# Patient Record
Sex: Female | Born: 1965 | Hispanic: Yes | Marital: Married
Health system: Southern US, Community
[De-identification: ages and names within clinical notes are randomized; demographics above are authoritative.]

## PROBLEM LIST (undated history)

## (undated) DIAGNOSIS — N200 Calculus of kidney: Secondary | ICD-10-CM

## (undated) DIAGNOSIS — N644 Mastodynia: Secondary | ICD-10-CM

## (undated) DIAGNOSIS — D75839 Thrombocytosis, unspecified: Secondary | ICD-10-CM

## (undated) DIAGNOSIS — D3A029 Benign carcinoid tumor of the large intestine, unspecified portion: Secondary | ICD-10-CM

## (undated) DIAGNOSIS — D473 Essential (hemorrhagic) thrombocythemia: Secondary | ICD-10-CM

## (undated) DIAGNOSIS — R896 Abnormal cytological findings in specimens from other organs, systems and tissues: Secondary | ICD-10-CM

## (undated) DIAGNOSIS — D471 Chronic myeloproliferative disease: Secondary | ICD-10-CM

## (undated) HISTORY — DX: Chronic myeloproliferative disease: D47.1

## (undated) HISTORY — DX: Thrombocytosis, unspecified: D75.839

## (undated) HISTORY — DX: Essential (hemorrhagic) thrombocythemia: D47.3

## (undated) HISTORY — DX: Abnormal cytological findings in specimens from other organs, systems and tissues: R89.6

## (undated) HISTORY — PX: TUBAL LIGATION: SHX77

## (undated) HISTORY — DX: Calculus of kidney: N20.0

## (undated) HISTORY — DX: Benign carcinoid tumor of the large intestine, unspecified portion: D3A.029

## (undated) HISTORY — DX: Mastodynia: N64.4

---

## 1999-11-18 ENCOUNTER — Encounter: Admission: RE | Admit: 1999-11-18 | Discharge: 1999-11-18 | Payer: Self-pay | Admitting: Family Medicine

## 2001-04-06 ENCOUNTER — Encounter: Admission: RE | Admit: 2001-04-06 | Discharge: 2001-04-06 | Payer: Self-pay | Admitting: Sports Medicine

## 2001-05-13 DIAGNOSIS — IMO0001 Reserved for inherently not codable concepts without codable children: Secondary | ICD-10-CM

## 2001-05-13 HISTORY — DX: Reserved for inherently not codable concepts without codable children: IMO0001

## 2001-07-13 ENCOUNTER — Encounter (INDEPENDENT_AMBULATORY_CARE_PROVIDER_SITE_OTHER): Payer: Self-pay | Admitting: *Deleted

## 2001-07-14 ENCOUNTER — Encounter: Admission: RE | Admit: 2001-07-14 | Discharge: 2001-07-14 | Payer: Self-pay | Admitting: Family Medicine

## 2001-07-19 ENCOUNTER — Ambulatory Visit (HOSPITAL_COMMUNITY): Admission: RE | Admit: 2001-07-19 | Discharge: 2001-07-19 | Payer: Self-pay | Admitting: Family Medicine

## 2001-08-09 ENCOUNTER — Encounter: Admission: RE | Admit: 2001-08-09 | Discharge: 2001-08-09 | Payer: Self-pay | Admitting: Sports Medicine

## 2003-05-30 ENCOUNTER — Encounter: Admission: RE | Admit: 2003-05-30 | Discharge: 2003-05-30 | Payer: Self-pay | Admitting: Family Medicine

## 2003-06-02 ENCOUNTER — Encounter: Admission: RE | Admit: 2003-06-02 | Discharge: 2003-06-02 | Payer: Self-pay | Admitting: Family Medicine

## 2004-01-16 ENCOUNTER — Encounter: Payer: Self-pay | Admitting: Family Medicine

## 2004-01-16 ENCOUNTER — Encounter: Admission: RE | Admit: 2004-01-16 | Discharge: 2004-01-16 | Payer: Self-pay | Admitting: Family Medicine

## 2004-02-16 ENCOUNTER — Encounter: Admission: RE | Admit: 2004-02-16 | Discharge: 2004-02-16 | Payer: Self-pay | Admitting: Family Medicine

## 2004-02-27 ENCOUNTER — Encounter: Admission: RE | Admit: 2004-02-27 | Discharge: 2004-02-27 | Payer: Self-pay | Admitting: Family Medicine

## 2005-03-20 ENCOUNTER — Other Ambulatory Visit: Admission: RE | Admit: 2005-03-20 | Discharge: 2005-03-20 | Payer: Self-pay | Admitting: Gynecology

## 2005-05-25 ENCOUNTER — Emergency Department (HOSPITAL_COMMUNITY): Admission: EM | Admit: 2005-05-25 | Discharge: 2005-05-25 | Payer: Self-pay | Admitting: Emergency Medicine

## 2006-04-03 ENCOUNTER — Other Ambulatory Visit: Admission: RE | Admit: 2006-04-03 | Discharge: 2006-04-03 | Payer: Self-pay | Admitting: Gynecology

## 2006-07-03 ENCOUNTER — Ambulatory Visit: Payer: Self-pay | Admitting: Family Medicine

## 2006-12-11 ENCOUNTER — Encounter (INDEPENDENT_AMBULATORY_CARE_PROVIDER_SITE_OTHER): Payer: Self-pay | Admitting: *Deleted

## 2007-04-13 ENCOUNTER — Other Ambulatory Visit: Admission: RE | Admit: 2007-04-13 | Discharge: 2007-04-13 | Payer: Self-pay | Admitting: Gynecology

## 2007-04-22 ENCOUNTER — Encounter: Admission: RE | Admit: 2007-04-22 | Discharge: 2007-04-22 | Payer: Self-pay | Admitting: Gynecology

## 2007-09-23 ENCOUNTER — Encounter: Admission: RE | Admit: 2007-09-23 | Discharge: 2007-09-23 | Payer: Self-pay | Admitting: Ophthalmology

## 2007-11-25 ENCOUNTER — Other Ambulatory Visit: Admission: RE | Admit: 2007-11-25 | Discharge: 2007-11-25 | Payer: Self-pay | Admitting: Gynecology

## 2007-12-20 ENCOUNTER — Encounter: Admission: RE | Admit: 2007-12-20 | Discharge: 2007-12-20 | Payer: Self-pay | Admitting: Family Medicine

## 2008-05-02 ENCOUNTER — Ambulatory Visit (HOSPITAL_COMMUNITY): Admission: RE | Admit: 2008-05-02 | Discharge: 2008-05-02 | Payer: Self-pay | Admitting: Gynecology

## 2008-05-02 ENCOUNTER — Other Ambulatory Visit: Admission: RE | Admit: 2008-05-02 | Discharge: 2008-05-02 | Payer: Self-pay | Admitting: Gynecology

## 2008-07-03 ENCOUNTER — Ambulatory Visit: Payer: Self-pay | Admitting: Gynecology

## 2008-07-12 ENCOUNTER — Ambulatory Visit: Payer: Self-pay | Admitting: Oncology

## 2008-07-18 LAB — CBC WITH DIFFERENTIAL/PLATELET
Basophils Absolute: 0 10*3/uL (ref 0.0–0.1)
Eosinophils Absolute: 0.1 10*3/uL (ref 0.0–0.5)
HGB: 12.6 g/dL (ref 11.6–15.9)
MONO#: 0.4 10*3/uL (ref 0.1–0.9)
NEUT#: 3.3 10*3/uL (ref 1.5–6.5)
RDW: 14.1 % (ref 11.3–14.5)
WBC: 5.3 10*3/uL (ref 3.9–10.0)
lymph#: 1.5 10*3/uL (ref 0.9–3.3)

## 2008-07-21 LAB — COMPREHENSIVE METABOLIC PANEL
ALT: 15 U/L (ref 0–35)
Albumin: 4.7 g/dL (ref 3.5–5.2)
CO2: 25 mEq/L (ref 19–32)
Glucose, Bld: 87 mg/dL (ref 70–99)
Potassium: 4.4 mEq/L (ref 3.5–5.3)
Sodium: 140 mEq/L (ref 135–145)
Total Protein: 7.3 g/dL (ref 6.0–8.3)

## 2008-07-21 LAB — IRON AND TIBC: %SAT: 29 % (ref 20–55)

## 2008-07-21 LAB — LACTATE DEHYDROGENASE: LDH: 121 U/L (ref 94–250)

## 2008-07-21 LAB — JAK2 GENOTYPR

## 2008-09-12 ENCOUNTER — Ambulatory Visit: Payer: Self-pay | Admitting: Oncology

## 2008-11-14 ENCOUNTER — Ambulatory Visit: Payer: Self-pay | Admitting: Oncology

## 2008-12-11 ENCOUNTER — Ambulatory Visit: Payer: Self-pay | Admitting: Gynecology

## 2008-12-18 ENCOUNTER — Ambulatory Visit: Payer: Self-pay | Admitting: Gynecology

## 2009-01-11 DIAGNOSIS — D3A029 Benign carcinoid tumor of the large intestine, unspecified portion: Secondary | ICD-10-CM

## 2009-01-11 HISTORY — DX: Benign carcinoid tumor of the large intestine, unspecified portion: D3A.029

## 2009-01-11 HISTORY — PX: COLONOSCOPY W/ BIOPSIES: SHX1374

## 2009-02-22 ENCOUNTER — Ambulatory Visit: Payer: Self-pay | Admitting: Oncology

## 2009-02-26 LAB — CBC & DIFF AND RETIC
BASO%: 0.5 % (ref 0.0–2.0)
Basophils Absolute: 0 10*3/uL (ref 0.0–0.1)
Eosinophils Absolute: 0.2 10*3/uL (ref 0.0–0.5)
HCT: 39.7 % (ref 34.8–46.6)
LYMPH%: 29.6 % (ref 14.0–49.7)
MCHC: 32.7 g/dL (ref 31.5–36.0)
MONO#: 0.4 10*3/uL (ref 0.1–0.9)
NEUT%: 61.9 % (ref 38.4–76.8)
Platelets: 708 10*3/uL — ABNORMAL HIGH (ref 145–400)
WBC: 6.7 10*3/uL (ref 3.9–10.3)

## 2009-02-26 LAB — MORPHOLOGY: RBC Comments: NORMAL

## 2009-03-02 LAB — FERRITIN: Ferritin: 28 ng/mL (ref 10–291)

## 2009-03-02 LAB — JAK2 GENOTYPR: JAK2 GenotypR: NOT DETECTED

## 2009-03-23 LAB — CBC WITH DIFFERENTIAL/PLATELET
Basophils Absolute: 0 10*3/uL (ref 0.0–0.1)
EOS%: 2.5 % (ref 0.0–7.0)
Eosinophils Absolute: 0.1 10*3/uL (ref 0.0–0.5)
HCT: 36 % (ref 34.8–46.6)
HGB: 12.2 g/dL (ref 11.6–15.9)
LYMPH%: 28.3 % (ref 14.0–49.7)
MCH: 28 pg (ref 25.1–34.0)
MCV: 82.4 fL (ref 79.5–101.0)
MONO%: 8.8 % (ref 0.0–14.0)
NEUT#: 3.4 10*3/uL (ref 1.5–6.5)
NEUT%: 60 % (ref 38.4–76.8)
Platelets: 645 10*3/uL — ABNORMAL HIGH (ref 145–400)
RDW: 14.4 % (ref 11.2–14.5)

## 2009-04-04 ENCOUNTER — Ambulatory Visit: Payer: Self-pay | Admitting: Oncology

## 2009-04-06 LAB — CBC WITH DIFFERENTIAL/PLATELET
Basophils Absolute: 0 10*3/uL (ref 0.0–0.1)
Eosinophils Absolute: 0.1 10*3/uL (ref 0.0–0.5)
HGB: 12.4 g/dL (ref 11.6–15.9)
LYMPH%: 30.5 % (ref 14.0–49.7)
MCV: 83.2 fL (ref 79.5–101.0)
MONO#: 0.5 10*3/uL (ref 0.1–0.9)
MONO%: 8.6 % (ref 0.0–14.0)
NEUT#: 3.5 10*3/uL (ref 1.5–6.5)
Platelets: 678 10*3/uL — ABNORMAL HIGH (ref 145–400)
RBC: 4.45 10*6/uL (ref 3.70–5.45)
WBC: 6 10*3/uL (ref 3.9–10.3)

## 2009-04-06 LAB — FERRITIN: Ferritin: 366 ng/mL — ABNORMAL HIGH (ref 10–291)

## 2009-04-20 LAB — CBC WITH DIFFERENTIAL/PLATELET
BASO%: 0.9 % (ref 0.0–2.0)
Eosinophils Absolute: 0.1 10*3/uL (ref 0.0–0.5)
LYMPH%: 28.5 % (ref 14.0–49.7)
MCHC: 33.1 g/dL (ref 31.5–36.0)
MCV: 83.8 fL (ref 79.5–101.0)
MONO%: 9.3 % (ref 0.0–14.0)
NEUT%: 60.1 % (ref 38.4–76.8)
Platelets: 615 10*3/uL — ABNORMAL HIGH (ref 145–400)
RBC: 4.4 10*6/uL (ref 3.70–5.45)

## 2009-04-20 LAB — FERRITIN: Ferritin: 421 ng/mL — ABNORMAL HIGH (ref 10–291)

## 2009-05-04 ENCOUNTER — Ambulatory Visit: Payer: Self-pay | Admitting: Oncology

## 2009-05-09 LAB — CBC WITH DIFFERENTIAL/PLATELET
BASO%: 0.7 % (ref 0.0–2.0)
EOS%: 1.3 % (ref 0.0–7.0)
HCT: 36.6 % (ref 34.8–46.6)
MCH: 27.6 pg (ref 25.1–34.0)
MCHC: 33.1 g/dL (ref 31.5–36.0)
MCV: 83.6 fL (ref 79.5–101.0)
MONO%: 11.2 % (ref 0.0–14.0)
NEUT%: 52 % (ref 38.4–76.8)
lymph#: 1.9 10*3/uL (ref 0.9–3.3)

## 2009-05-15 LAB — CHROMOGRANIN A

## 2009-06-05 ENCOUNTER — Ambulatory Visit: Payer: Self-pay | Admitting: Oncology

## 2009-08-10 ENCOUNTER — Other Ambulatory Visit: Admission: RE | Admit: 2009-08-10 | Discharge: 2009-08-10 | Payer: Self-pay | Admitting: Gynecology

## 2009-08-10 ENCOUNTER — Ambulatory Visit: Payer: Self-pay | Admitting: Gynecology

## 2009-08-10 ENCOUNTER — Encounter: Payer: Self-pay | Admitting: Gynecology

## 2009-10-04 ENCOUNTER — Ambulatory Visit: Payer: Self-pay | Admitting: Oncology

## 2009-10-04 LAB — CBC WITH DIFFERENTIAL/PLATELET
Basophils Absolute: 0 10*3/uL (ref 0.0–0.1)
Eosinophils Absolute: 0.1 10*3/uL (ref 0.0–0.5)
HCT: 38.7 % (ref 34.8–46.6)
HGB: 12.8 g/dL (ref 11.6–15.9)
LYMPH%: 29.4 % (ref 14.0–49.7)
MCV: 84.6 fL (ref 79.5–101.0)
MONO%: 8.5 % (ref 0.0–14.0)
NEUT#: 3.4 10*3/uL (ref 1.5–6.5)
Platelets: 559 10*3/uL — ABNORMAL HIGH (ref 145–400)

## 2009-11-23 ENCOUNTER — Ambulatory Visit: Payer: Self-pay | Admitting: Oncology

## 2009-12-31 ENCOUNTER — Ambulatory Visit: Payer: Self-pay | Admitting: Oncology

## 2010-04-26 ENCOUNTER — Ambulatory Visit: Payer: Self-pay | Admitting: Gynecology

## 2010-08-12 ENCOUNTER — Ambulatory Visit: Payer: Self-pay | Admitting: Gynecology

## 2010-08-12 ENCOUNTER — Other Ambulatory Visit: Admission: RE | Admit: 2010-08-12 | Discharge: 2010-08-12 | Payer: Self-pay | Admitting: Gynecology

## 2011-08-12 ENCOUNTER — Encounter: Payer: Self-pay | Admitting: Anesthesiology

## 2011-08-15 ENCOUNTER — Other Ambulatory Visit (HOSPITAL_COMMUNITY)
Admission: RE | Admit: 2011-08-15 | Discharge: 2011-08-15 | Disposition: A | Discharge status: Home / Self Care | Payer: BC Managed Care – PPO | Source: Ambulatory Visit | Attending: Gynecology | Admitting: Gynecology

## 2011-08-15 ENCOUNTER — Ambulatory Visit (INDEPENDENT_AMBULATORY_CARE_PROVIDER_SITE_OTHER): Payer: BC Managed Care – PPO | Admitting: Gynecology

## 2011-08-15 ENCOUNTER — Encounter: Payer: Self-pay | Admitting: Gynecology

## 2011-08-15 VITALS — BP 118/70 | Ht 61.5 in | Wt 127.0 lb

## 2011-08-15 DIAGNOSIS — R823 Hemoglobinuria: Secondary | ICD-10-CM

## 2011-08-15 DIAGNOSIS — D126 Benign neoplasm of colon, unspecified: Secondary | ICD-10-CM

## 2011-08-15 DIAGNOSIS — Z01419 Encounter for gynecological examination (general) (routine) without abnormal findings: Secondary | ICD-10-CM | POA: Insufficient documentation

## 2011-08-15 DIAGNOSIS — D47Z9 Other specified neoplasms of uncertain behavior of lymphoid, hematopoietic and related tissue: Secondary | ICD-10-CM

## 2011-08-15 DIAGNOSIS — Z13220 Encounter for screening for lipoid disorders: Secondary | ICD-10-CM

## 2011-08-15 DIAGNOSIS — D471 Chronic myeloproliferative disease: Secondary | ICD-10-CM

## 2011-08-15 DIAGNOSIS — R634 Abnormal weight loss: Secondary | ICD-10-CM

## 2011-08-15 DIAGNOSIS — D473 Essential (hemorrhagic) thrombocythemia: Secondary | ICD-10-CM

## 2011-08-15 DIAGNOSIS — K635 Polyp of colon: Secondary | ICD-10-CM | POA: Insufficient documentation

## 2011-08-15 NOTE — Progress Notes (Signed)
Amanda Harris 12/03/1965 [id number]   History:    45 y.o.  for annual exam with history of myeloproliferative disease (thrombocytosis) who has been followed by the hematologist oncologist Dr. Jessie Harris at [facility]. She scheduled to see them next month. 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 She had a colonoscopy 3 years ago benign polyps were noted. She has a followup appointment with the gas urologist next month. She's also set an upper endoscopy in the past and she's had H. pylori. She's had a previous tubal sterilization procedure. She's also has a history of right renal lithiasis. She also has 2 family members with lung cancer both nonsmokers patient had negative chest x-ray 2 years ago.  Past medical history,surgical history, family history and social history were all reviewed and documented in the EPIC chart.  ROS:  Was performed and pertinent positives and negatives are included in the history.  Exam: chaperone present BP 118/70  Ht 5' 1.5" (1.562 m)  Wt 127 lb (57.607 kg)  BMI 23.61 kg/m2  LMP 08/02/2011  Body mass index is 23.61 kg/(m^2).  General appearance : Well developed well nourished female. No acute distress HEENT: Neck supple, trachea midline, no carotid bruits, no thyroidmegaly Lungs: Clear to auscultation, no rhonchi or wheezes, or rib retractions  Heart: Regular rate and rhythm, no murmurs or gallops Breast:Examined in sitting and supine position were symmetrical in appearance, no palpable masses or tenderness,  no skin retraction, no nipple inversion, no nipple discharge, no skin discoloration, no axillary or supraclavicular lymphadenopathy Abdomen: no palpable masses or tenderness, no rebound or guarding Extremities: no edema or skin discoloration or tenderness  Pelvic:  Bartholin, Urethra, Skene Glands: Within normal limits             Vagina: No gross lesions or discharge  Cervix: No gross lesions  or discharge  Uterus  anteverted, normal size, shape and consistency, non-tender and mobile  Adnexa  Without masses or tenderness  Anus and perineum  normal   Rectovaginal  normal sphincter tone without palpated masses or tenderness             Hemoccult kit provided for patient's mid to the office   Assessment/Plan:  45 y.o. female for annual exam unremarkable gynecological examination. Pap smear was done today. She will follow up with her gastroenterologist next month her colonoscopy. She will followup with the hematologist oncologist at [facility] for her thrombocytosis. And she will followup for the urologist since she is having on and off symptoms of right flank pain discomfort. We will check her CBC urinalysis cholesterol TSH and random blood sugar and notify does any abnormality of the above-mentioned test she's also to set the fecal occult blood tests kit to [place] for testing and she was encouraged to do her monthly self breast examination to schedule her mammogram.    Amanda Edwards MD, 9:59 AM 08/15/2011

## 2011-08-19 ENCOUNTER — Other Ambulatory Visit: Payer: Self-pay | Admitting: *Deleted

## 2011-08-19 DIAGNOSIS — R8271 Bacteriuria: Secondary | ICD-10-CM

## 2011-08-22 ENCOUNTER — Ambulatory Visit (INDEPENDENT_AMBULATORY_CARE_PROVIDER_SITE_OTHER): Payer: BC Managed Care – PPO | Admitting: *Deleted

## 2011-08-22 DIAGNOSIS — R823 Hemoglobinuria: Secondary | ICD-10-CM

## 2011-08-22 DIAGNOSIS — R8271 Bacteriuria: Secondary | ICD-10-CM

## 2011-08-25 MED ORDER — NITROFURANTOIN MONOHYD MACRO 100 MG PO CAPS: 100.0000 mg | ORAL_CAPSULE | Freq: Two times a day (BID) | ORAL | Status: AC

## 2011-10-16 ENCOUNTER — Encounter: Payer: Self-pay | Admitting: Gynecology

## 2011-10-23 DIAGNOSIS — Z1211 Encounter for screening for malignant neoplasm of colon: Secondary | ICD-10-CM

## 2011-10-27 ENCOUNTER — Other Ambulatory Visit: Payer: Self-pay | Admitting: Gynecology

## 2011-10-27 DIAGNOSIS — Z1211 Encounter for screening for malignant neoplasm of colon: Secondary | ICD-10-CM

## 2012-08-17 ENCOUNTER — Encounter: Payer: BC Managed Care – PPO | Admitting: Gynecology

## 2012-08-20 ENCOUNTER — Encounter: Payer: BC Managed Care – PPO | Admitting: Gynecology

## 2012-10-22 ENCOUNTER — Encounter: Payer: Self-pay | Admitting: Gynecology

## 2012-10-22 ENCOUNTER — Other Ambulatory Visit (HOSPITAL_COMMUNITY)
Admission: RE | Admit: 2012-10-22 | Discharge: 2012-10-22 | Disposition: A | Discharge status: Home / Self Care | Payer: BC Managed Care – PPO | Source: Ambulatory Visit | Attending: Gynecology | Admitting: Gynecology

## 2012-10-22 ENCOUNTER — Ambulatory Visit (HOSPITAL_COMMUNITY)
Admission: RE | Admit: 2012-10-22 | Discharge: 2012-10-22 | Disposition: A | Discharge status: Home / Self Care | Payer: BC Managed Care – PPO | Source: Ambulatory Visit | Attending: Gynecology | Admitting: Gynecology

## 2012-10-22 ENCOUNTER — Ambulatory Visit (INDEPENDENT_AMBULATORY_CARE_PROVIDER_SITE_OTHER): Payer: BC Managed Care – PPO | Admitting: Gynecology

## 2012-10-22 VITALS — BP 108/68 | Ht 61.0 in | Wt 125.0 lb

## 2012-10-22 DIAGNOSIS — Z8639 Personal history of other endocrine, nutritional and metabolic disease: Secondary | ICD-10-CM

## 2012-10-22 DIAGNOSIS — Z1211 Encounter for screening for malignant neoplasm of colon: Secondary | ICD-10-CM

## 2012-10-22 DIAGNOSIS — D3A026 Benign carcinoid tumor of the rectum: Secondary | ICD-10-CM | POA: Insufficient documentation

## 2012-10-22 DIAGNOSIS — Z1151 Encounter for screening for human papillomavirus (HPV): Secondary | ICD-10-CM | POA: Insufficient documentation

## 2012-10-22 DIAGNOSIS — W19XXXA Unspecified fall, initial encounter: Secondary | ICD-10-CM | POA: Insufficient documentation

## 2012-10-22 DIAGNOSIS — Z01419 Encounter for gynecological examination (general) (routine) without abnormal findings: Secondary | ICD-10-CM | POA: Insufficient documentation

## 2012-10-22 DIAGNOSIS — M533 Sacrococcygeal disorders, not elsewhere classified: Secondary | ICD-10-CM | POA: Insufficient documentation

## 2012-10-22 LAB — LIPID PANEL
Cholesterol: 147 mg/dL (ref 0–200)
LDL Cholesterol: 88 mg/dL (ref 0–99)
Total CHOL/HDL Ratio: 3.1 Ratio
Triglycerides: 53 mg/dL (ref <150)
VLDL: 11 mg/dL (ref 0–40)

## 2012-10-22 LAB — COMPREHENSIVE METABOLIC PANEL
AST: 19 U/L (ref 0–37)
Alkaline Phosphatase: 53 U/L (ref 39–117)
BUN: 11 mg/dL (ref 6–23)
Calcium: 9.4 mg/dL (ref 8.4–10.5)
Creat: 0.56 mg/dL (ref 0.50–1.10)
Glucose, Bld: 78 mg/dL (ref 70–99)

## 2012-10-22 MED ORDER — IBUPROFEN 800 MG PO TABS: 800.0000 mg | ORAL_TABLET | Freq: Three times a day (TID) | ORAL | Status: DC | PRN

## 2012-10-22 MED ORDER — PREDNISONE (PAK) 10 MG PO TABS: 10.0000 mg | ORAL_TABLET | Freq: Every day | ORAL | Status: DC

## 2012-10-22 MED ORDER — CYCLOBENZAPRINE HCL 5 MG PO TABS: ORAL_TABLET | ORAL | Status: DC

## 2012-10-22 NOTE — Patient Instructions (Addendum)
Hacer cita con Dra mann para colonoscopia.

## 2012-10-22 NOTE — Progress Notes (Addendum)
Amanda Harris 1966/07/02 [id number]   History:    47 y.o.  for annual gyn exam with no complaints. 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 She had been seeing Dr. Darnelle Catalan but has not seen him in quite some time and she is no longer taking the Anagrelide. She is scheduled for colonoscopy this year. Patient has had history vitamin D deficiency in the past and she's currently taking vitamin D 2000 units daily. She is also been treated in the past for H. pylori. Patient with prior tubal sterilization procedure and is having normal menstrual cycles and is doing her monthly self breast examination.  Review of her record indicated in November 2002 she had a Pap smear with ascus and ever since then her Pap smears been normal and her last Pap smear was in November 2012  Patient has informed she's had some coccygeal tenderness as a result of a fall 5 days ago. It is affecting some of her mobility sitting and do not. She denies any radiculopathy.  Patient's sister and mother both nonsmokers have had history of lung cancer. Patient's last chest x-ray was approximately 4 years ago. Patient had been evaluated by urologist in 2012 and passed a right kidney stone spontaneously.  Past medical history,surgical history, family history and social history were all reviewed and documented in the EPIC chart.  Gynecologic History Patient's last menstrual period was 10/11/2012. Contraception: tubal ligation Last Pap: 2012. Results were: normal Last mammogram: 2012. Results were: normal  Obstetric History OB History    Grav Para Term Preterm Abortions TAB SAB Ect Mult Living   3 3 3       3        # Outc Date GA Lbr Len/2nd Wgt Sex Del Anes PTL Lv   1 TRM     M SVD   Yes   2 TRM     F SVD   Yes   3 TRM     M SVD   Yes       ROS: A ROS was performed and pertinent positives and negatives are included in the history.  GENERAL: No fevers or chills. HEENT: No change in vision, no earache, sore throat or sinus congestion. NECK: No pain or stiffness. CARDIOVASCULAR: No chest pain or pressure. No palpitations. PULMONARY: No shortness of breath, cough or wheeze. GASTROINTESTINAL: No abdominal pain, nausea, vomiting or diarrhea, melena or bright red blood per rectum. GENITOURINARY: No urinary frequency, urgency, hesitancy or dysuria. MUSCULOSKELETAL: No joint or muscle pain, no back pain, no recent trauma. DERMATOLOGIC: No rash, no itching, no lesions. ENDOCRINE: No polyuria, polydipsia, no heat or cold intolerance. No recent change in weight. HEMATOLOGICAL: No anemia or easy bruising or bleeding. NEUROLOGIC: No headache, seizures, numbness, tingling or weakness. PSYCHIATRIC: No depression, no loss of interest in normal activity or change in sleep pattern.     Exam: chaperone present  BP 108/68  Ht 5\' 1"  (1.549 m)  Wt 125 lb (56.7 kg)  BMI 23.62 kg/m2  LMP 10/11/2012  Body mass index is 23.62 kg/(m^2).  General appearance : Well developed well nourished female. No acute distress HEENT: Neck supple, trachea midline, no carotid bruits, no thyroidmegaly Lungs: Clear to auscultation, no  rhonchi or wheezes, or rib retractions  Heart: Regular rate and rhythm, no murmurs or gallops Breast:Examined in sitting and supine position were symmetrical in appearance, no palpable masses or tenderness,  no skin retraction, no nipple inversion, no nipple discharge, no skin discoloration, no axillary or supraclavicular lymphadenopathy Abdomen: no palpable masses or tenderness, no rebound or guarding Extremities: no edema or skin discoloration or tenderness  Pelvic:  Bartholin, Urethra, Skene Glands:  Within normal limits             Vagina: No gross lesions or discharge  Cervix: No gross lesions or discharge  Uterus  anteverted, normal size, shape and consistency, non-tender and mobile  Adnexa  Without masses or tenderness  Anus and perineum  normal   Rectovaginal  normal sphincter tone without palpated masses or tenderness             Hemoccult negative result in the office today     Assessment/Plan:  47 y.o. femalewith normal gynecological exam today. She was tender in the sacral/coccyx area. She will be sent over to the hospital for a sacrum/coccyx x-ray to rule out fracture. She will be placed on a prednisone pack (Sterapred uni pack 10 mg) to take over 6 days. She was also given a prescription for Flexeril 5 mg to take 1 by mouth before bedtime as well as Motrin 800 mg 3 times a day for 5-7 days. A Pap smear was done today. She was encouraged to do her monthly self breast examination. The following labs were ordered today: CBC, fasting lipid profile, TSH, as well as comprehensive metabolic panel and urinalysis and vitamin D. She will be scheduling her mammogram as well as her colonoscopy. Patient declined flu vaccine.    Ok Edwards MD, 1:29 PM 10/22/2012

## 2012-10-23 LAB — URINALYSIS W MICROSCOPIC + REFLEX CULTURE
Bacteria, UA: NONE SEEN
Bilirubin Urine: NEGATIVE
Glucose, UA: NEGATIVE mg/dL
Ketones, ur: NEGATIVE mg/dL
Protein, ur: NEGATIVE mg/dL
Squamous Epithelial / LPF: NONE SEEN
Urobilinogen, UA: 0.2 mg/dL (ref 0.0–1.0)

## 2012-10-23 LAB — CBC WITH DIFFERENTIAL/PLATELET
Basophils Relative: 1 % (ref 0–1)
HCT: 39.5 % (ref 36.0–46.0)
Hemoglobin: 12.6 g/dL (ref 12.0–15.0)
Lymphs Abs: 1.4 10*3/uL (ref 0.7–4.0)
MCH: 26 pg (ref 26.0–34.0)
MCHC: 31.9 g/dL (ref 30.0–36.0)
Monocytes Absolute: 0.5 10*3/uL (ref 0.1–1.0)
Monocytes Relative: 7 % (ref 3–12)
Neutro Abs: 5.2 10*3/uL (ref 1.7–7.7)
Neutrophils Relative %: 71 % (ref 43–77)
RBC: 4.85 MIL/uL (ref 3.87–5.11)

## 2012-10-23 LAB — VITAMIN D 25 HYDROXY (VIT D DEFICIENCY, FRACTURES): Vit D, 25-Hydroxy: 37 ng/mL (ref 30–89)

## 2012-10-25 ENCOUNTER — Telehealth: Payer: Self-pay | Admitting: *Deleted

## 2012-10-25 NOTE — Telephone Encounter (Signed)
1. Amanda Harris from cancer called back and said that patient has been discharged from practice due to non compliance with appointments, I spoke with the patient about this as well. I told her to make follow up appointment with the doctor at [facility] she told me that she will do this.  2. Pt asked what did her back x-ray show as well? I told pt is said No acute or traumatic finding. She asked if you had seen it?

## 2012-10-25 NOTE — Telephone Encounter (Signed)
Please tell her that the xray was negative. Stress to her the importance of being seen at [facility]. See if she needs for [place] to make the appointment so I can send last note and labs. JF

## 2012-10-25 NOTE — Telephone Encounter (Signed)
Pt informed with all the below note. 

## 2012-10-25 NOTE — Telephone Encounter (Signed)
Pt informed with the below note, she requested appointment at cancer center. Appointment on 11/03/12 with Dr. Clelia Croft at 10:30 am left message for pt to call.

## 2012-10-25 NOTE — Telephone Encounter (Signed)
Message copied by Aura Camps on Mon Oct 25, 2012  9:22 AM ------      Message from: Ok Edwards      Created: Sun Oct 24, 2012  5:10 PM       Please inform patient that she needs to followup with Dr. Minda Ditto at [facility] who she has been seeing in the past for her thrombocytosis. Tell her that her platelet counts were very very high this time with a counter [phone] years ago it was 559,000. She could followup with Dr. Darnelle Catalan in [place] if she likes but she needs to get in to see either one of this week. If she chooses to go back to [facility] send her physician a copy of these last labs which shows the trend since 2010.

## 2012-11-03 ENCOUNTER — Ambulatory Visit: Payer: BC Managed Care – PPO | Admitting: Oncology

## 2012-11-03 ENCOUNTER — Other Ambulatory Visit: Payer: BC Managed Care – PPO | Admitting: Lab

## 2012-11-03 ENCOUNTER — Ambulatory Visit: Payer: BC Managed Care – PPO

## 2012-12-13 ENCOUNTER — Encounter: Payer: Self-pay | Admitting: Gynecology

## 2013-10-28 ENCOUNTER — Ambulatory Visit (INDEPENDENT_AMBULATORY_CARE_PROVIDER_SITE_OTHER): Payer: BC Managed Care – PPO | Admitting: Gynecology

## 2013-10-28 ENCOUNTER — Encounter: Payer: Self-pay | Admitting: Gynecology

## 2013-10-28 VITALS — BP 126/80 | Ht 61.0 in | Wt 129.0 lb

## 2013-10-28 DIAGNOSIS — Z801 Family history of malignant neoplasm of trachea, bronchus and lung: Secondary | ICD-10-CM

## 2013-10-28 DIAGNOSIS — Z8639 Personal history of other endocrine, nutritional and metabolic disease: Secondary | ICD-10-CM

## 2013-10-28 DIAGNOSIS — Z01419 Encounter for gynecological examination (general) (routine) without abnormal findings: Secondary | ICD-10-CM

## 2013-10-28 LAB — CBC WITH DIFFERENTIAL/PLATELET
Basophils Absolute: 0 10*3/uL (ref 0.0–0.1)
Basophils Relative: 1 % (ref 0–1)
EOS ABS: 0 10*3/uL (ref 0.0–0.7)
Eosinophils Relative: 1 % (ref 0–5)
HEMATOCRIT: 37.9 % (ref 36.0–46.0)
HEMOGLOBIN: 12.2 g/dL (ref 12.0–15.0)
LYMPHS ABS: 1.3 10*3/uL (ref 0.7–4.0)
Lymphocytes Relative: 38 % (ref 12–46)
MCH: 25.5 pg — AB (ref 26.0–34.0)
MCHC: 32.2 g/dL (ref 30.0–36.0)
MCV: 79.3 fL (ref 78.0–100.0)
MONO ABS: 0.3 10*3/uL (ref 0.1–1.0)
MONOS PCT: 9 % (ref 3–12)
NEUTROS PCT: 51 % (ref 43–77)
Neutro Abs: 1.7 10*3/uL (ref 1.7–7.7)
Platelets: 571 10*3/uL — ABNORMAL HIGH (ref 150–400)
RBC: 4.78 MIL/uL (ref 3.87–5.11)
RDW: 15.3 % (ref 11.5–15.5)
WBC: 3.3 10*3/uL — ABNORMAL LOW (ref 4.0–10.5)

## 2013-10-28 NOTE — Progress Notes (Signed)
Amanda Harris 1966/09/22 [id number]   History:    48 y.o.  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 Patient had a normal colonoscopy in 2014. Patient with history of essential thrombocytosis who is currently being followed by hematologist oncologist at [facility] Dr. 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 Patient has had history vitamin D deficiency in the past and she's currently taking vitamin D 2000 units daily. She is also been treated in the past for H. pylori. Patient with prior tubal sterilization procedure and is having normal menstrual cycles and is doing her monthly self breast examination.  The patient's mother and sister both had lung cancer and they were nonsmoker. Last chest x-ray done here was in 2009. November 2002 she had a Pap smear with ascus and ever since then her Pap smears been normal and her last Pap smear was in 2014.Patient had been evaluated by urologist in 2012 and passed a right kidney stone spontaneously.    Past medical history,surgical history, family history and social history were all reviewed and documented in the EPIC chart.  Gynecologic History Patient's last menstrual period was 10/22/2013. Contraception: tubal ligation Last Pap: 2014. Results were: normal Last mammogram: 2014. Results were: normal  Obstetric History OB History  Gravida Para Term Preterm AB SAB TAB Ectopic Multiple Living  3 3 3       3     # Outcome Date GA Lbr Len/2nd Weight Sex Delivery Anes PTL Lv  3 TRM     M SVD   Y  2 TRM     F SVD   Y  1 TRM     M SVD   Y       ROS: A ROS was performed and pertinent positives and negatives are included in the history.  GENERAL: No fevers or chills. HEENT: No change in vision, no earache, sore throat or  sinus congestion. NECK: No pain or stiffness. CARDIOVASCULAR: No chest pain or pressure. No palpitations. PULMONARY: No shortness of breath, cough or wheeze. GASTROINTESTINAL: No abdominal pain, nausea, vomiting or diarrhea, melena or bright red blood per rectum. GENITOURINARY: No urinary frequency, urgency, hesitancy or dysuria. MUSCULOSKELETAL: No joint or muscle pain, no back pain, no recent trauma. DERMATOLOGIC: No rash, no itching, no lesions. ENDOCRINE: No polyuria, polydipsia, no heat or cold intolerance. No recent change in weight. HEMATOLOGICAL: No anemia or easy bruising or bleeding. NEUROLOGIC: No headache, seizures, numbness, tingling or weakness. PSYCHIATRIC: No depression, no loss of interest in normal activity or change in sleep pattern.     Exam: chaperone present  BP 126/80  Ht 5\' 1"  (1.549 m)  Wt 129 lb (58.514 kg)  BMI 24.39 kg/m2  LMP 10/22/2013  Body mass index is 24.39 kg/(m^2).  General appearance : Well developed well nourished female. No acute distress HEENT: Neck supple, trachea midline, no carotid bruits, no thyroidmegaly Lungs: Clear to auscultation, no rhonchi or wheezes, or rib retractions  Heart: Regular rate and rhythm, no murmurs or gallops Breast:Examined in sitting and supine position were symmetrical in appearance, no palpable masses or tenderness,  no skin retraction, no nipple inversion, no nipple discharge, no skin discoloration, no axillary or supraclavicular lymphadenopathy Abdomen: no palpable masses or tenderness, no rebound or guarding Extremities: no edema or skin discoloration or tenderness  Pelvic:  Bartholin, Urethra, Skene Glands: Within normal limits             Vagina: No gross lesions or discharge  Cervix: No gross lesions or discharge  Uterus  anteverted, normal size, shape and consistency, non-tender and mobile  Adnexa  Without masses or tenderness  Anus and perineum  normal   Rectovaginal  normal sphincter tone without palpated  masses or tenderness             Hemoccult colonoscopy 2014     Assessment/Plan:  48 y.o. female for annual exam with history of essential thrombocytosis currently being followed by hematologist oncologist at [facility]. Patient with first degree relatives mother and sister with lung cancer and nonsmoker's. A chest x-ray PA and lateral be ordered today. The following labs were ordered also: Vitamin D level, fasting lipid profile, comprehensive metabolic panel, CBC, TSH and urinalysis. Pap smear was not done today in accordance to the new guidelines. Patient was reminded to do her monthly breast exams. We discussed importance of calcium and vitamin D in regular exercise for osteoporosis prevention.  Note: This dictation was prepared with  Dragon/digital dictation along withSmart phrase technology. Any transcriptional errors that result from this process are unintentional.   Terrance Mass MD, 12:51 PM 10/28/2013

## 2013-10-29 LAB — URINALYSIS W MICROSCOPIC + REFLEX CULTURE
BILIRUBIN URINE: NEGATIVE
Bacteria, UA: NONE SEEN
CASTS: NONE SEEN
CRYSTALS: NONE SEEN
GLUCOSE, UA: NEGATIVE mg/dL
Ketones, ur: NEGATIVE mg/dL
Leukocytes, UA: NEGATIVE
Nitrite: NEGATIVE
PH: 6 (ref 5.0–8.0)
Protein, ur: NEGATIVE mg/dL
SPECIFIC GRAVITY, URINE: 1.026 (ref 1.005–1.030)
SQUAMOUS EPITHELIAL / LPF: NONE SEEN
Urobilinogen, UA: 0.2 mg/dL (ref 0.0–1.0)

## 2013-10-29 LAB — COMPREHENSIVE METABOLIC PANEL
ALBUMIN: 4.1 g/dL (ref 3.5–5.2)
ALT: 29 U/L (ref 0–35)
AST: 28 U/L (ref 0–37)
Alkaline Phosphatase: 54 U/L (ref 39–117)
BILIRUBIN TOTAL: 0.7 mg/dL (ref 0.3–1.2)
BUN: 14 mg/dL (ref 6–23)
CO2: 27 meq/L (ref 19–32)
Calcium: 9 mg/dL (ref 8.4–10.5)
Chloride: 103 mEq/L (ref 96–112)
Creat: 0.58 mg/dL (ref 0.50–1.10)
GLUCOSE: 80 mg/dL (ref 70–99)
POTASSIUM: 4.4 meq/L (ref 3.5–5.3)
SODIUM: 137 meq/L (ref 135–145)
TOTAL PROTEIN: 7 g/dL (ref 6.0–8.3)

## 2013-10-29 LAB — LIPID PANEL
CHOLESTEROL: 148 mg/dL (ref 0–200)
HDL: 54 mg/dL (ref >39)
LDL Cholesterol: 87 mg/dL (ref 0–99)
TRIGLYCERIDES: 35 mg/dL (ref <150)
Total CHOL/HDL Ratio: 2.7 Ratio
VLDL: 7 mg/dL (ref 0–40)

## 2013-10-29 LAB — TSH: TSH: 1.162 u[IU]/mL (ref 0.350–4.500)

## 2013-10-29 LAB — VITAMIN D 25 HYDROXY (VIT D DEFICIENCY, FRACTURES): Vit D, 25-Hydroxy: 29 ng/mL — ABNORMAL LOW (ref 30–89)

## 2013-11-17 ENCOUNTER — Ambulatory Visit (HOSPITAL_COMMUNITY)
Admission: RE | Admit: 2013-11-17 | Discharge: 2013-11-17 | Disposition: A | Discharge status: Home / Self Care | Payer: BC Managed Care – PPO | Source: Ambulatory Visit | Attending: Gynecology | Admitting: Gynecology

## 2013-11-17 DIAGNOSIS — D3A00 Benign carcinoid tumor of unspecified site: Secondary | ICD-10-CM | POA: Insufficient documentation

## 2013-11-17 DIAGNOSIS — Z801 Family history of malignant neoplasm of trachea, bronchus and lung: Secondary | ICD-10-CM | POA: Insufficient documentation

## 2014-02-20 ENCOUNTER — Encounter: Payer: Self-pay | Admitting: Gynecology

## 2014-08-14 ENCOUNTER — Encounter: Payer: Self-pay | Admitting: Gynecology

## 2014-09-21 ENCOUNTER — Ambulatory Visit (INDEPENDENT_AMBULATORY_CARE_PROVIDER_SITE_OTHER): Payer: BC Managed Care – PPO | Admitting: Gynecology

## 2014-09-21 VITALS — BP 104/60

## 2014-09-21 DIAGNOSIS — Z8619 Personal history of other infectious and parasitic diseases: Secondary | ICD-10-CM

## 2014-09-21 DIAGNOSIS — R82998 Other abnormal findings in urine: Secondary | ICD-10-CM

## 2014-09-21 DIAGNOSIS — Z8639 Personal history of other endocrine, nutritional and metabolic disease: Secondary | ICD-10-CM

## 2014-09-21 DIAGNOSIS — R8299 Other abnormal findings in urine: Secondary | ICD-10-CM

## 2014-09-21 DIAGNOSIS — R1013 Epigastric pain: Secondary | ICD-10-CM | POA: Insufficient documentation

## 2014-09-21 DIAGNOSIS — Z862 Personal history of diseases of the blood and blood-forming organs and certain disorders involving the immune mechanism: Secondary | ICD-10-CM

## 2014-09-21 LAB — URINALYSIS W MICROSCOPIC + REFLEX CULTURE
Bilirubin Urine: NEGATIVE
CASTS: NONE SEEN
CRYSTALS: NONE SEEN
GLUCOSE, UA: NEGATIVE mg/dL
Ketones, ur: NEGATIVE mg/dL
LEUKOCYTES UA: NEGATIVE
Nitrite: NEGATIVE
PH: 5.5 (ref 5.0–8.0)
Protein, ur: NEGATIVE mg/dL
SPECIFIC GRAVITY, URINE: 1.02 (ref 1.005–1.030)
Urobilinogen, UA: 0.2 mg/dL (ref 0.0–1.0)

## 2014-09-21 NOTE — Progress Notes (Signed)
   Patient is a 48 year old who presented to the office today to discuss several issues. She was having midepigastric discomfort for the past 2 months. The pain usually occur in the morning around 9 AM her relief when she begins eating. She has no problems with bowel movements or urination all her she described her urine was concentrated although no dysuria reported. 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 Patient had a normal colonoscopy in 2014. Patient with history of essential thrombocytosis who is currently being followed by hematologist oncologist at [facility] Dr. 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 Patient has had history vitamin D deficiency in the past and she's currently taking vitamin D 2000 units daily. She is also been treated in the past for H. Pylori.On exam patient was not in any acute distress and her abdomen was soft nontender with the exception of midepigastric on deep palpation she had slight tenderness. Her urinalysis demonstrated 0-2 WBC, 3-6 RBC and rare bacteria.  Patient wanted to have platelet counts checked since it been 2 months since she saw her oncologist. She is currently on Pegsdys subcutaneous injections every 2 weeks.  Assessment/plan: #1 past history of H. pylori we'll obtain stool for H. pylori in the event every exacerbation and needed to be treated with antibiotic. #2 will schedule an upper abdominal ultrasound to rule out the possibility cholelithiasis. #3 history vitamin D deficiency will check a vitamin D level today #4 history of thrombocytosis we'll check platelet counts today with a CBC #5 we'll run a urine culture today.

## 2014-09-22 ENCOUNTER — Other Ambulatory Visit: Payer: Self-pay | Admitting: Gynecology

## 2014-09-22 ENCOUNTER — Telehealth: Payer: Self-pay | Admitting: *Deleted

## 2014-09-22 DIAGNOSIS — R1013 Epigastric pain: Secondary | ICD-10-CM

## 2014-09-22 LAB — CBC WITH DIFFERENTIAL/PLATELET
BASOS PCT: 0 % (ref 0–1)
Basophils Absolute: 0 10*3/uL (ref 0.0–0.1)
Eosinophils Absolute: 0 10*3/uL (ref 0.0–0.7)
Eosinophils Relative: 1 % (ref 0–5)
HCT: 35.6 % — ABNORMAL LOW (ref 36.0–46.0)
Hemoglobin: 11.6 g/dL — ABNORMAL LOW (ref 12.0–15.0)
Lymphocytes Relative: 34 % (ref 12–46)
Lymphs Abs: 1.6 10*3/uL (ref 0.7–4.0)
MCH: 26 pg (ref 26.0–34.0)
MCHC: 32.6 g/dL (ref 30.0–36.0)
MCV: 79.6 fL (ref 78.0–100.0)
MONO ABS: 0.5 10*3/uL (ref 0.1–1.0)
MPV: 9 fL — ABNORMAL LOW (ref 9.4–12.4)
Monocytes Relative: 10 % (ref 3–12)
Neutro Abs: 2.6 10*3/uL (ref 1.7–7.7)
Neutrophils Relative %: 55 % (ref 43–77)
Platelets: 597 10*3/uL — ABNORMAL HIGH (ref 150–400)
RBC: 4.47 MIL/uL (ref 3.87–5.11)
RDW: 14.9 % (ref 11.5–15.5)
WBC: 4.8 10*3/uL (ref 4.0–10.5)

## 2014-09-22 LAB — VITAMIN D 25 HYDROXY (VIT D DEFICIENCY, FRACTURES): Vit D, 25-Hydroxy: 32 ng/mL (ref 30–100)

## 2014-09-22 NOTE — Telephone Encounter (Signed)
Appointment on 10/10/14 @ 8:15 am at [facility] hospital must be NPO 6-8 hours prior to exam pt aware of all this

## 2014-09-22 NOTE — Telephone Encounter (Signed)
-----   Message from Terrance Mass, MD sent at 09/21/2014  4:48 PM EST ----- Anderson Malta, please contact patient and tell her that before she left I forgot to mention that I would like for her to have an upper abdominal ultrasound because of her midepigastric pains to rule out the possibility of cholelithiasis.

## 2014-09-23 LAB — HELICOBACTER PYLORI  SPECIAL ANTIGEN: H. PYLORI ANTIGEN STOOL: NEGATIVE

## 2014-09-23 LAB — URINE CULTURE
Colony Count: NO GROWTH
Organism ID, Bacteria: NO GROWTH

## 2014-10-10 ENCOUNTER — Ambulatory Visit (HOSPITAL_COMMUNITY): Admission: RE | Admit: 2014-10-10 | Payer: BC Managed Care – PPO | Source: Ambulatory Visit

## 2014-11-03 ENCOUNTER — Encounter: Payer: BC Managed Care – PPO | Admitting: Gynecology

## 2014-11-08 ENCOUNTER — Ambulatory Visit (INDEPENDENT_AMBULATORY_CARE_PROVIDER_SITE_OTHER): Payer: BLUE CROSS/BLUE SHIELD | Admitting: Gynecology

## 2014-11-08 ENCOUNTER — Encounter: Payer: Self-pay | Admitting: Gynecology

## 2014-11-08 VITALS — BP 112/70 | Ht 61.0 in | Wt 131.0 lb

## 2014-11-08 DIAGNOSIS — D473 Essential (hemorrhagic) thrombocythemia: Secondary | ICD-10-CM

## 2014-11-08 DIAGNOSIS — D75839 Thrombocytosis, unspecified: Secondary | ICD-10-CM

## 2014-11-08 DIAGNOSIS — R1013 Epigastric pain: Secondary | ICD-10-CM

## 2014-11-08 DIAGNOSIS — D508 Other iron deficiency anemias: Secondary | ICD-10-CM | POA: Insufficient documentation

## 2014-11-08 DIAGNOSIS — Z01419 Encounter for gynecological examination (general) (routine) without abnormal findings: Secondary | ICD-10-CM

## 2014-11-08 NOTE — Progress Notes (Signed)
Amanda Harris 1966/10/04 [id number]   History:    49 y.o.  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 Patient had a normal colonoscopy in 2014. Patient with history of essential thrombocytosis who is currently being followed by hematologist oncologist at [facility] Dr. 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 Patient has had history vitamin D deficiency in the past and she's currently taking vitamin D 2000 units daily. She is also been treated in the past for H. Pylori.  The patient's mother and sister both had lung cancer and they were nonsmoker. Last chest x-ray done here was in 2009. November 2002 she had a Pap smear with ascus and ever since then her Pap smears been normal and her last Pap smear was in 2014.Patient had been evaluated by urologist in 2012 and passed a right kidney stone spontaneously.   Past medical history,surgical history, family history and social history were all reviewed and documented in the EPIC chart.  Gynecologic History Patient's last menstrual period was 10/22/2014. Contraception: tubal ligation Last Pap: 2014. Results were: normal Last mammogram: 2015. Results were: normal  Obstetric History OB History  Gravida Para Term Preterm AB SAB TAB Ectopic Multiple Living  3 3 3       3     # Outcome Date GA Lbr Len/2nd Weight Sex Delivery Anes PTL Lv  3 Term     M Vag-Spont   Y  2 Term     F Vag-Spont   Y  1 Term     M Vag-Spont   Y       ROS: A ROS was performed and pertinent positives and negatives are included in the history.  GENERAL: No fevers or chills. HEENT: No change in vision, no earache, sore throat or sinus congestion. NECK: No pain or stiffness. CARDIOVASCULAR: No chest pain or pressure. No palpitations. PULMONARY: No  shortness of breath, cough or wheeze. GASTROINTESTINAL: No abdominal pain, nausea, vomiting or diarrhea, melena or bright red blood per rectum. GENITOURINARY: No urinary frequency, urgency, hesitancy or dysuria. MUSCULOSKELETAL: No joint or muscle pain, no back pain, no recent trauma. DERMATOLOGIC: No rash, no itching, no lesions. ENDOCRINE: No polyuria, polydipsia, no heat or cold intolerance. No recent change in weight. HEMATOLOGICAL: No anemia or easy bruising or bleeding. NEUROLOGIC: No headache, seizures, numbness, tingling or weakness. PSYCHIATRIC: No depression, no loss of interest in normal activity or change in sleep pattern.     Exam: chaperone present  BP 112/70 mmHg  Ht 5\' 1"  (1.549 m)  Wt 131 lb (59.421 kg)  BMI 24.76 kg/m2  LMP 10/22/2014  Body mass index is 24.76 kg/(m^2).  General appearance : Well developed well nourished female. No acute distress HEENT: Neck supple, trachea midline, no carotid bruits, no thyroidmegaly Lungs: Clear to auscultation, no rhonchi or wheezes, or rib retractions  Heart: Regular rate and rhythm, no murmurs or gallops Breast:Examined in sitting and supine position were symmetrical in appearance, no palpable masses or tenderness,  no skin retraction, no nipple inversion, no nipple discharge, no skin discoloration, no axillary or supraclavicular lymphadenopathy Abdomen: no palpable masses or tenderness, no rebound or guarding Extremities: no edema or skin discoloration or tenderness  Pelvic:  Bartholin, Urethra, Skene Glands: Within normal limits  Vagina: No gross lesions or discharge  Cervix: No gross lesions or discharge  Uterus  anteverted, normal size, shape and consistency, non-tender and mobile  Adnexa  Without masses or tenderness  Anus and perineum  normal   Rectovaginal  normal sphincter tone without palpated masses or tenderness             Hemoccult not done     Assessment/Plan:  49 y.o. female for annual gynecological  exam with history of essential thrombocytosis currently being followed by hematologist oncologist at [facility]. Patient will return back next week in a fasting state for fasting lipid profile, conference metabolic panel, CBC, TSH and urinalysis. Pap smear was not done today in accordance to the new guidelines. Patient received used a flu vaccine today. Patient was reminded on the importance of calcium vitamin D and regular exercise for osteoporosis prevention. Her vitamin D level last month was in the normal range with a value of 32. Patient recently started taking iron supplementation since her hemoglobin was 11.4.  Terrance Mass MD, 4:41 PM 11/08/2014

## 2014-11-09 ENCOUNTER — Telehealth: Payer: Self-pay | Admitting: *Deleted

## 2014-11-09 DIAGNOSIS — Z01419 Encounter for gynecological examination (general) (routine) without abnormal findings: Secondary | ICD-10-CM

## 2014-11-09 NOTE — Telephone Encounter (Signed)
-----   Message from Sinclair Grooms sent at 11/09/2014  9:23 AM EST ----- Regarding: lab orders "Patient will return back next week in a fasting state for fasting lipid profile, conference metabolic panel, CBC, TSH and urinalysis. " note from 11-08-14  Please enter orders pt will be here for labs Friday.

## 2014-11-09 NOTE — Telephone Encounter (Signed)
Orders placed.

## 2014-11-10 ENCOUNTER — Other Ambulatory Visit: Payer: BLUE CROSS/BLUE SHIELD

## 2014-11-10 LAB — CBC WITH DIFFERENTIAL/PLATELET
Basophils Absolute: 0 10*3/uL (ref 0.0–0.1)
Basophils Relative: 1 % (ref 0–1)
EOS PCT: 0 % (ref 0–5)
Eosinophils Absolute: 0 10*3/uL (ref 0.0–0.7)
HEMATOCRIT: 36.2 % (ref 36.0–46.0)
HEMOGLOBIN: 11.8 g/dL — AB (ref 12.0–15.0)
LYMPHS PCT: 36 % (ref 12–46)
Lymphs Abs: 1.3 10*3/uL (ref 0.7–4.0)
MCH: 25.9 pg — ABNORMAL LOW (ref 26.0–34.0)
MCHC: 32.6 g/dL (ref 30.0–36.0)
MCV: 79.4 fL (ref 78.0–100.0)
MONOS PCT: 11 % (ref 3–12)
MPV: 9.3 fL (ref 8.6–12.4)
Monocytes Absolute: 0.4 10*3/uL (ref 0.1–1.0)
NEUTROS PCT: 52 % (ref 43–77)
Neutro Abs: 1.9 10*3/uL (ref 1.7–7.7)
PLATELETS: 534 10*3/uL — AB (ref 150–400)
RBC: 4.56 MIL/uL (ref 3.87–5.11)
RDW: 14.3 % (ref 11.5–15.5)
WBC: 3.7 10*3/uL — ABNORMAL LOW (ref 4.0–10.5)

## 2014-11-10 LAB — COMPREHENSIVE METABOLIC PANEL
ALT: 20 U/L (ref 0–35)
AST: 22 U/L (ref 0–37)
Albumin: 3.9 g/dL (ref 3.5–5.2)
Alkaline Phosphatase: 51 U/L (ref 39–117)
BUN: 11 mg/dL (ref 6–23)
CO2: 23 mEq/L (ref 19–32)
CREATININE: 0.58 mg/dL (ref 0.50–1.10)
Calcium: 9.1 mg/dL (ref 8.4–10.5)
Chloride: 103 mEq/L (ref 96–112)
Glucose, Bld: 75 mg/dL (ref 70–99)
Potassium: 4.2 mEq/L (ref 3.5–5.3)
SODIUM: 134 meq/L — AB (ref 135–145)
Total Bilirubin: 0.7 mg/dL (ref 0.2–1.2)
Total Protein: 6.5 g/dL (ref 6.0–8.3)

## 2014-11-10 LAB — LIPID PANEL
CHOL/HDL RATIO: 2.5 ratio
Cholesterol: 139 mg/dL (ref 0–200)
HDL: 56 mg/dL (ref >39)
LDL Cholesterol: 77 mg/dL (ref 0–99)
TRIGLYCERIDES: 32 mg/dL (ref <150)
VLDL: 6 mg/dL (ref 0–40)

## 2014-11-10 LAB — TSH: TSH: 0.556 u[IU]/mL (ref 0.350–4.500)

## 2014-11-10 NOTE — Addendum Note (Signed)
Addended by: Federico Flake on: 11/10/2014 12:23 PM   Modules accepted: Orders

## 2014-11-11 LAB — URINALYSIS W MICROSCOPIC + REFLEX CULTURE
BILIRUBIN URINE: NEGATIVE
Bacteria, UA: NONE SEEN
CASTS: NONE SEEN
CRYSTALS: NONE SEEN
GLUCOSE, UA: NEGATIVE mg/dL
Hgb urine dipstick: NEGATIVE
Leukocytes, UA: NEGATIVE
Nitrite: NEGATIVE
Protein, ur: NEGATIVE mg/dL
SQUAMOUS EPITHELIAL / LPF: NONE SEEN
Specific Gravity, Urine: 1.019 (ref 1.005–1.030)
UROBILINOGEN UA: 0.2 mg/dL (ref 0.0–1.0)
pH: 5.5 (ref 5.0–8.0)

## 2014-11-21 ENCOUNTER — Ambulatory Visit (INDEPENDENT_AMBULATORY_CARE_PROVIDER_SITE_OTHER): Payer: BLUE CROSS/BLUE SHIELD

## 2014-11-21 VITALS — BP 110/67 | HR 76 | Resp 12

## 2014-11-21 DIAGNOSIS — S90122D Contusion of left lesser toe(s) without damage to nail, subsequent encounter: Secondary | ICD-10-CM

## 2014-11-21 DIAGNOSIS — L603 Nail dystrophy: Secondary | ICD-10-CM

## 2014-11-21 DIAGNOSIS — L600 Ingrowing nail: Secondary | ICD-10-CM

## 2014-11-21 NOTE — Progress Notes (Signed)
   Subjective:    Patient ID: Amanda Harris, female    DOB: 1966-05-31, 49 y.o.   MRN: [id number]  HPI  PT STATED LT FOOT GREAT TOENAIL IS CURVING IN AND BEEN PAINFUL FOR 3 YEARS. THE TOENAIL IS GETTING WORSE AND PAINFUL TO TOUCH. TRIED TO TRIM IT IT HELP SOME.  Review of Systems  Constitutional: Positive for chills.  Neurological: Positive for numbness.  Hematological: Bruises/bleeds easily.       Objective:   Physical Exam 49 year old Hispanic female well-developed well-nourished oriented 3 presents this time with a complaint of hospital injury or damage to her left great toenail which is resulted and proptosis or incurvation of the nail. Painful on direct lateral compression there is some elevation of the toe and the nail is severely curled consistent with a pincher-type nail. Starting objective female findings masker status is intact pedal pulses are palpable DP and PT +2 over 4 Refill time 3 seconds all digits epicritic and proprioceptive sensations intact and symmetric there is normal plantar response DTRs not listed dermatologically skin color pigment normal hair growth absent nails normal trophic however proptosis incurvation left hallux is noted pain on direct lateral compression. Orthopedic exam unremarkable rectus foot type mild flexible digital contractures are noted. No open wounds no ulcers no secondary infections.       Assessment & Plan:  Assessment this time nail dystrophy with incurvated ingrowing nail or onychocryptosis. Plan at this time recommended either conservative care padding cushioning accommodative shoes partial nail excision of borders however based on the severe deformity where articulation likely is for total nail excision with phenol matricectomy. We discussed this risk up medications alternatives patient is given that option will consider her option at some point follow-up in appointment in the future for possible permanent AP nail procedure when ready for  maintain accommodative shoes avoid pressure on the area    Harriet Masson DPM

## 2014-12-01 ENCOUNTER — Emergency Department (HOSPITAL_COMMUNITY): Payer: BLUE CROSS/BLUE SHIELD

## 2014-12-01 ENCOUNTER — Encounter (HOSPITAL_COMMUNITY): Payer: Self-pay | Admitting: Emergency Medicine

## 2014-12-01 DIAGNOSIS — Z87442 Personal history of urinary calculi: Secondary | ICD-10-CM | POA: Diagnosis not present

## 2014-12-01 DIAGNOSIS — Z87448 Personal history of other diseases of urinary system: Secondary | ICD-10-CM | POA: Insufficient documentation

## 2014-12-01 DIAGNOSIS — R002 Palpitations: Secondary | ICD-10-CM | POA: Diagnosis not present

## 2014-12-01 DIAGNOSIS — R000 Tachycardia, unspecified: Secondary | ICD-10-CM | POA: Diagnosis not present

## 2014-12-01 DIAGNOSIS — Z79899 Other long term (current) drug therapy: Secondary | ICD-10-CM | POA: Insufficient documentation

## 2014-12-01 DIAGNOSIS — D473 Essential (hemorrhagic) thrombocythemia: Secondary | ICD-10-CM | POA: Insufficient documentation

## 2014-12-01 DIAGNOSIS — R0789 Other chest pain: Secondary | ICD-10-CM | POA: Diagnosis not present

## 2014-12-01 DIAGNOSIS — R0602 Shortness of breath: Secondary | ICD-10-CM | POA: Diagnosis present

## 2014-12-01 LAB — CBC
HEMATOCRIT: 37.4 % (ref 36.0–46.0)
Hemoglobin: 12 g/dL (ref 12.0–15.0)
MCH: 25.8 pg — ABNORMAL LOW (ref 26.0–34.0)
MCHC: 32.1 g/dL (ref 30.0–36.0)
MCV: 80.3 fL (ref 78.0–100.0)
PLATELETS: 519 10*3/uL — AB (ref 150–400)
RBC: 4.66 MIL/uL (ref 3.87–5.11)
RDW: 13.2 % (ref 11.5–15.5)
WBC: 4.7 10*3/uL (ref 4.0–10.5)

## 2014-12-01 LAB — BASIC METABOLIC PANEL
ANION GAP: 7 (ref 5–15)
BUN: 12 mg/dL (ref 6–23)
CALCIUM: 9.4 mg/dL (ref 8.4–10.5)
CO2: 29 mmol/L (ref 19–32)
Chloride: 102 mmol/L (ref 96–112)
Creatinine, Ser: 1.61 mg/dL — ABNORMAL HIGH (ref 0.50–1.10)
GFR calc Af Amer: 43 mL/min — ABNORMAL LOW (ref >90)
GFR calc non Af Amer: 37 mL/min — ABNORMAL LOW (ref >90)
Glucose, Bld: 93 mg/dL (ref 70–99)
Potassium: 4.3 mmol/L (ref 3.5–5.1)
SODIUM: 138 mmol/L (ref 135–145)

## 2014-12-01 LAB — I-STAT TROPONIN, ED: TROPONIN I, POC: 0 ng/mL (ref 0.00–0.08)

## 2014-12-01 LAB — BRAIN NATRIURETIC PEPTIDE: B NATRIURETIC PEPTIDE 5: 14.9 pg/mL (ref 0.0–100.0)

## 2014-12-01 NOTE — ED Notes (Signed)
C/o sob and heart racing that started around 8pm while watching tv.  Denies chest pain.  Pt to ED via [facility].

## 2014-12-02 ENCOUNTER — Emergency Department (HOSPITAL_COMMUNITY)
Admission: EM | Admit: 2014-12-02 | Discharge: 2014-12-02 | Disposition: A | Discharge status: Home / Self Care | Payer: BLUE CROSS/BLUE SHIELD | Attending: Emergency Medicine | Admitting: Emergency Medicine

## 2014-12-02 DIAGNOSIS — R0789 Other chest pain: Secondary | ICD-10-CM

## 2014-12-02 DIAGNOSIS — R002 Palpitations: Secondary | ICD-10-CM

## 2014-12-02 LAB — D-DIMER, QUANTITATIVE (NOT AT ARMC)

## 2014-12-02 LAB — I-STAT TROPONIN, ED: Troponin i, poc: 0 ng/mL (ref 0.00–0.08)

## 2014-12-02 NOTE — ED Provider Notes (Signed)
CSN: [id number]     Arrival date & time 12/01/14  2109 History  This chart was scribed for Amanda Rice, MD by Peyton Bottoms, ED Scribe. This patient was seen in room A13C/A13C and the patient's care was started at 2:11 AM.   Chief Complaint  Patient presents with  . Shortness of Breath  . Tachycardia   Patient is a 49 y.o. female presenting with shortness of breath. The history is provided by the patient. No language interpreter was used.  Shortness of Breath Severity:  Moderate Onset quality:  Sudden Duration:  6 hours Timing:  Constant Progression:  Waxing and waning Chronicity:  New Relieved by:  Nothing Worsened by:  Nothing tried Ineffective treatments:  None tried Associated symptoms: no abdominal pain, no chest pain, no cough, no fever, no headaches, no neck pain, no rash, no vomiting and no wheezing    HPI Comments: Amanda Harris is a 49 y.o. female with a PMHx of mastodynia, ASCUS, thrombocytosis, myeloproliferative disease, carcinoid tumor of colon, and kidney stones, brought in by EMS, who presents to the Emergency Department complaining of moderate shortness of breath and palpitations that began at 8:00PM. She reports associated pressure to left chest. She states that she was watching TV during onset of pain. She currently denies shortness of breath. She denies recent travel. She denies use of birth control. She denies leg swelling, abdominal pain, nausea or vomiting. She reports intermittent mild numbness to legs. She denies drinking coffee or energy drinks.   Past Medical History  Diagnosis Date  . Mastodynia   . ASCUS (atypical squamous cells of undetermined significance) on Pap smear 05/2001    C & B BX- ASCUS   . Thrombocytosis   . Myeloproliferative disease     JAK2 MUTATION ... FOLLOWED BY DR. NAGALLA AT [facility].  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 MANN  . Kidney stones    Past Surgical History  Procedure Laterality Date  .  Tubal ligation      POST PARTUM  . Colonoscopy w/ biopsies  APRIL 2010    LOW-GRADE NEUROENDOCRINE-CARCINOID TUMOR   Family History  Problem Relation Age of Onset  . Cancer Mother     LUNG (NON-SMOKER)  . Cancer Sister     LUNG (NON-SMOKER)   History  Substance Use Topics  . Smoking status: Never Smoker   . Smokeless tobacco: Never Used  . Alcohol Use: No   OB History    Gravida Para Term Preterm AB TAB SAB Ectopic Multiple Living   3 3 3       3      Review of Systems  Constitutional: Negative for fever and chills.  Respiratory: Positive for chest tightness and shortness of breath. Negative for cough, wheezing and stridor.   Cardiovascular: Positive for palpitations. Negative for chest pain and leg swelling.  Gastrointestinal: Negative for nausea, vomiting, abdominal pain, diarrhea and constipation.  Musculoskeletal: Negative for back pain, neck pain and neck stiffness.  Skin: Negative for rash and wound.  Neurological: Negative for dizziness, weakness, light-headedness, numbness and headaches.  All other systems reviewed and are negative.  Allergies  Review of patient's allergies indicates no known allergies.  Home Medications   Prior to Admission medications   Medication Sig Start Date End Date Taking? Authorizing Provider  ferrous sulfate 325 (65 FE) MG tablet Take 325 mg by mouth daily with breakfast.   Yes Historical Provider, MD  VITAMIN D, ERGOCALCIFEROL, PO Take  by mouth.   Yes Historical Provider, MD  peginterferon alfa-2a (PEGASYS) 180 MCG/ML injection Inject 180 mcg into the skin. Once every two weeks    Historical Provider, MD   Triage Vitals: BP 103/58 mmHg  Pulse 58  Temp(Src) 97.6 F (36.4 C) (Oral)  Resp 16  SpO2 100%  LMP 11/24/2014  Physical Exam  Constitutional: She is oriented to person, place, and time. She appears well-developed and well-nourished. No distress.  Anxious appearing, tearful  HENT:  Head: Normocephalic and atraumatic.   Mouth/Throat: Oropharynx is clear and moist.  Eyes: EOM are normal. Pupils are equal, round, and reactive to light.  Neck: Normal range of motion. Neck supple.  Cardiovascular: Normal rate and regular rhythm.   Pulmonary/Chest: Effort normal and breath sounds normal. No respiratory distress. She has no wheezes. She has no rales. She exhibits no tenderness.  Abdominal: Soft. Bowel sounds are normal. She exhibits no distension and no mass. There is no tenderness. There is no rebound and no guarding.  Musculoskeletal: Normal range of motion. She exhibits no edema or tenderness.  No calf swelling or tenderness.  Neurological: She is alert and oriented to person, place, and time.  Skin: Skin is warm and dry. No rash noted. No erythema.  Psychiatric: Her behavior is normal.  Anxious  Nursing note and vitals reviewed.   ED Course  Procedures (including critical care time)  DIAGNOSTIC STUDIES: Oxygen Saturation is 100% on RA, normal by my interpretation.    COORDINATION OF CARE: 2:14 AM- Discussed plans to order diagnostic lab work, EKG and CXR. Pt advised of plan for treatment and pt agrees.  Labs Review Labs Reviewed  CBC - Abnormal; Notable for the following:    MCH 25.8 (*)    Platelets 519 (*)    All other components within normal limits  BASIC METABOLIC PANEL - Abnormal; Notable for the following:    Creatinine, Ser 1.61 (*)    GFR calc non Af Amer 37 (*)    GFR calc Af Amer 43 (*)    All other components within normal limits  BRAIN NATRIURETIC PEPTIDE  I-STAT TROPOININ, ED   Imaging Review Dg Chest 2 View  12/01/2014   CLINICAL DATA:  Shortness of breath, tachycardia  EXAM: CHEST  2 VIEW  COMPARISON:  11/17/2013  FINDINGS: Cardiomediastinal silhouette is stable. No acute infiltrate or pleural effusion. No pulmonary edema. Bony thorax is unremarkable.  IMPRESSION: No active cardiopulmonary disease.   Electronically Signed   By: Lahoma Crocker M.D.   On: 12/01/2014 22:11    EKG  Interpretation   Date/Time:  Friday December 01 2014 21:14:04 EST Ventricular Rate:  77 PR Interval:  148 QRS Duration: 78 QT Interval:  376 QTC Calculation: 425 R Axis:   76 Text Interpretation:  Normal sinus rhythm Normal ECG Confirmed by  Lita Mains  MD, Alisah Grandberry ([id number]) on 12/02/2014 4:51:50 AM     MDM   Final diagnoses:  None    I personally performed the services described in this documentation, which was scribed in my presence. The recorded information has been reviewed and is accurate.    D-dimer is normal. Troponin 2 negative. EKG without any acute findings. Doubt ACS. Patient's symptoms have improved. Likely anxiety is playing a role. We'll give cardiology referral for follow-up. Return precautions given.  Amanda Rice, MD 12/02/14 [phone]

## 2014-12-02 NOTE — Discharge Instructions (Signed)
Chest Pain (Nonspecific) °It is often hard to give a specific diagnosis for the cause of chest pain. There is always a chance that your pain could be related to something serious, such as a heart attack or a blood clot in the lungs. You need to follow up with your health care provider for further evaluation. °CAUSES  °· Heartburn. °· Pneumonia or bronchitis. °· Anxiety or stress. °· Inflammation around your heart (pericarditis) or lung (pleuritis or pleurisy). °· A blood clot in the lung. °· A collapsed lung (pneumothorax). It can develop suddenly on its own (spontaneous pneumothorax) or from trauma to the chest. °· Shingles infection (herpes zoster virus). °The chest wall is composed of bones, muscles, and cartilage. Any of these can be the source of the pain. °· The bones can be bruised by injury. °· The muscles or cartilage can be strained by coughing or overwork. °· The cartilage can be affected by inflammation and become sore (costochondritis). °DIAGNOSIS  °Lab tests or other studies may be needed to find the cause of your pain. Your health care provider may have you take a test called an ambulatory electrocardiogram (ECG). An ECG records your heartbeat patterns over a 24-hour period. You may also have other tests, such as: °· Transthoracic echocardiogram (TTE). During echocardiography, sound waves are used to evaluate how blood flows through your heart. °· Transesophageal echocardiogram (TEE). °· Cardiac monitoring. This allows your health care provider to monitor your heart rate and rhythm in real time. °· Holter monitor. This is a portable device that records your heartbeat and can help diagnose heart arrhythmias. It allows your health care provider to track your heart activity for several days, if needed. °· Stress tests by exercise or by giving medicine that makes the heart beat faster. °TREATMENT  °· Treatment depends on what may be causing your chest pain. Treatment may include: °· Acid blockers for  heartburn. °· Anti-inflammatory medicine. °· Pain medicine for inflammatory conditions. °· Antibiotics if an infection is present. °· You may be advised to change lifestyle habits. This includes stopping smoking and avoiding alcohol, caffeine, and chocolate. °· You may be advised to keep your head raised (elevated) when sleeping. This reduces the chance of acid going backward from your stomach into your esophagus. °Most of the time, nonspecific chest pain will improve within 2-3 days with rest and mild pain medicine.  °HOME CARE INSTRUCTIONS  °· If antibiotics were prescribed, take them as directed. Finish them even if you start to feel better. °· For the next few days, avoid physical activities that bring on chest pain. Continue physical activities as directed. °· Do not use any tobacco products, including cigarettes, chewing tobacco, or electronic cigarettes. °· Avoid drinking alcohol. °· Only take medicine as directed by your health care provider. °· Follow your health care provider's suggestions for further testing if your chest pain does not go away. °· Keep any follow-up appointments you made. If you do not go to an appointment, you could develop lasting (chronic) problems with pain. If there is any problem keeping an appointment, call to reschedule. °SEEK MEDICAL CARE IF:  °· Your chest pain does not go away, even after treatment. °· You have a rash with blisters on your chest. °· You have a fever. °SEEK IMMEDIATE MEDICAL CARE IF:  °· You have increased chest pain or pain that spreads to your arm, neck, jaw, back, or abdomen. °· You have shortness of breath. °· You have an increasing cough, or you cough   up blood. °· You have severe back or abdominal pain. °· You feel nauseous or vomit. °· You have severe weakness. °· You faint. °· You have chills. °This is an emergency. Do not wait to see if the pain will go away. Get medical help at once. Call your local emergency services (911 in [place] Do not drive  yourself to the hospital. °MAKE SURE YOU:  °· Understand these instructions. °· Will watch your condition. °· Will get help right away if you are not doing well or get worse. °Document Released: 07/09/2005 Document Revised: 10/04/2013 Document Reviewed: 05/04/2008 °ExitCare® Patient Information ©2015 [employer]. This information is not intended to replace advice given to you by your health care provider. Make sure you discuss any questions you have with your health care provider. ° ° °Palpitations °A palpitation is the feeling that your heartbeat is irregular or is faster than normal. It may feel like your heart is fluttering or skipping a beat. Palpitations are usually not a serious problem. However, in some cases, you may need further medical evaluation. °CAUSES  °Palpitations can be caused by: °· Smoking. °· Caffeine or other stimulants, such as diet pills or energy drinks. °· Alcohol. °· Stress and anxiety. °· Strenuous physical activity. °· Fatigue. °· Certain medicines. °· Heart disease, especially if you have a history of irregular heart rhythms (arrhythmias), such as atrial fibrillation, atrial flutter, or supraventricular tachycardia. °· An improperly working pacemaker or defibrillator. °DIAGNOSIS  °To find the cause of your palpitations, your health care provider will take your medical history and perform a physical exam. Your health care provider may also have you take a test called an ambulatory electrocardiogram (ECG). An ECG records your heartbeat patterns over a 24-hour period. You may also have other tests, such as: °· Transthoracic echocardiogram (TTE). During echocardiography, sound waves are used to evaluate how blood flows through your heart. °· Transesophageal echocardiogram (TEE). °· Cardiac monitoring. This allows your health care provider to monitor your heart rate and rhythm in real time. °· Holter monitor. This is a portable device that records your heartbeat and can help diagnose heart  arrhythmias. It allows your health care provider to track your heart activity for several days, if needed. °· Stress tests by exercise or by giving medicine that makes the heart beat faster. °TREATMENT  °Treatment of palpitations depends on the cause of your symptoms and can vary greatly. Most cases of palpitations do not require any treatment other than time, relaxation, and monitoring your symptoms. Other causes, such as atrial fibrillation, atrial flutter, or supraventricular tachycardia, usually require further treatment. °HOME CARE INSTRUCTIONS  °· Avoid: °¨ Caffeinated coffee, tea, soft drinks, diet pills, and energy drinks. °¨ Chocolate. °¨ Alcohol. °· Stop smoking if you smoke. °· Reduce your stress and anxiety. Things that can help you relax include: °¨ A method of controlling things in your body, such as your heartbeats, with your mind (biofeedback). °¨ Yoga. °¨ Meditation. °¨ Physical activity such as swimming, jogging, or walking. °· Get plenty of rest and sleep. °SEEK MEDICAL CARE IF:  °· You continue to have a fast or irregular heartbeat beyond 24 hours. °· Your palpitations occur more often. °SEEK IMMEDIATE MEDICAL CARE IF: °· You have chest pain or shortness of breath. °· You have a severe headache. °· You feel dizzy or you faint. °MAKE SURE YOU: °· Understand these instructions. °· Will watch your condition. °· Will get help right away if you are not doing well or get worse. °  Document Released: 09/26/2000 Document Revised: 10/04/2013 Document Reviewed: 11/28/2011 °ExitCare® Patient Information ©2015 [employer]. This information is not intended to replace advice given to you by your health care provider. Make sure you discuss any questions you have with your health care provider. ° ° °

## 2015-02-08 ENCOUNTER — Telehealth: Payer: Self-pay | Admitting: *Deleted

## 2015-02-08 DIAGNOSIS — R1013 Epigastric pain: Secondary | ICD-10-CM

## 2015-02-08 NOTE — Telephone Encounter (Signed)
Pt canceled the abdomen ultrasound per telephone encounter 09/22/14 for midepigastric pains to rule out the possibility of cholelithiasis., pt would like this to be rescheduled order has expired new order will be placed and scheduled.  appointment on 02/12/15 @ 7:30am check in 7:15am npo 6 hours prior to ultrasound. Pt aware.

## 2015-02-12 ENCOUNTER — Ambulatory Visit (HOSPITAL_COMMUNITY): Payer: BLUE CROSS/BLUE SHIELD

## 2015-02-13 ENCOUNTER — Ambulatory Visit (HOSPITAL_COMMUNITY)
Admission: RE | Admit: 2015-02-13 | Discharge: 2015-02-13 | Disposition: A | Discharge status: Home / Self Care | Payer: BLUE CROSS/BLUE SHIELD | Source: Ambulatory Visit | Attending: Gynecology | Admitting: Gynecology

## 2015-02-13 ENCOUNTER — Telehealth: Payer: Self-pay | Admitting: *Deleted

## 2015-02-13 ENCOUNTER — Other Ambulatory Visit: Payer: Self-pay | Admitting: Gynecology

## 2015-02-13 DIAGNOSIS — R1013 Epigastric pain: Secondary | ICD-10-CM

## 2015-02-13 NOTE — Telephone Encounter (Signed)
Left on pt voicemail normal results.

## 2015-02-13 NOTE — Telephone Encounter (Signed)
-----   Message from Terrance Mass, MD sent at 02/13/2015  2:40 PM EDT ----- Inform patient that the ultrasound was normal ----- Message -----    From: Thamas Jaegers    Sent: 02/13/2015   2:25 PM      To: Terrance Mass, MD  You order pt canceled and reschedule from 09/21/14 note "will schedule an upper abdominal ultrasound to rule out the possibility cholelithiasis. ----- Message -----    From: Terrance Mass, MD    Sent: 02/13/2015  12:55 PM      To: Thora Lance, please check who ordered this upper abdominal ultrasound on this patient. I have not seen her in several months in my last note does not indicated that I had ordered it?

## 2015-05-11 IMAGING — CR DG CHEST 2V
2 series · 2 of 2 positions shown · non-contrast
Comparison: 05/02/2008

CLINICAL DATA: History of carcinoid. Family history of lung cancer.

EXAM:
CHEST  2 VIEW

[view not recorded (1 of 2)]
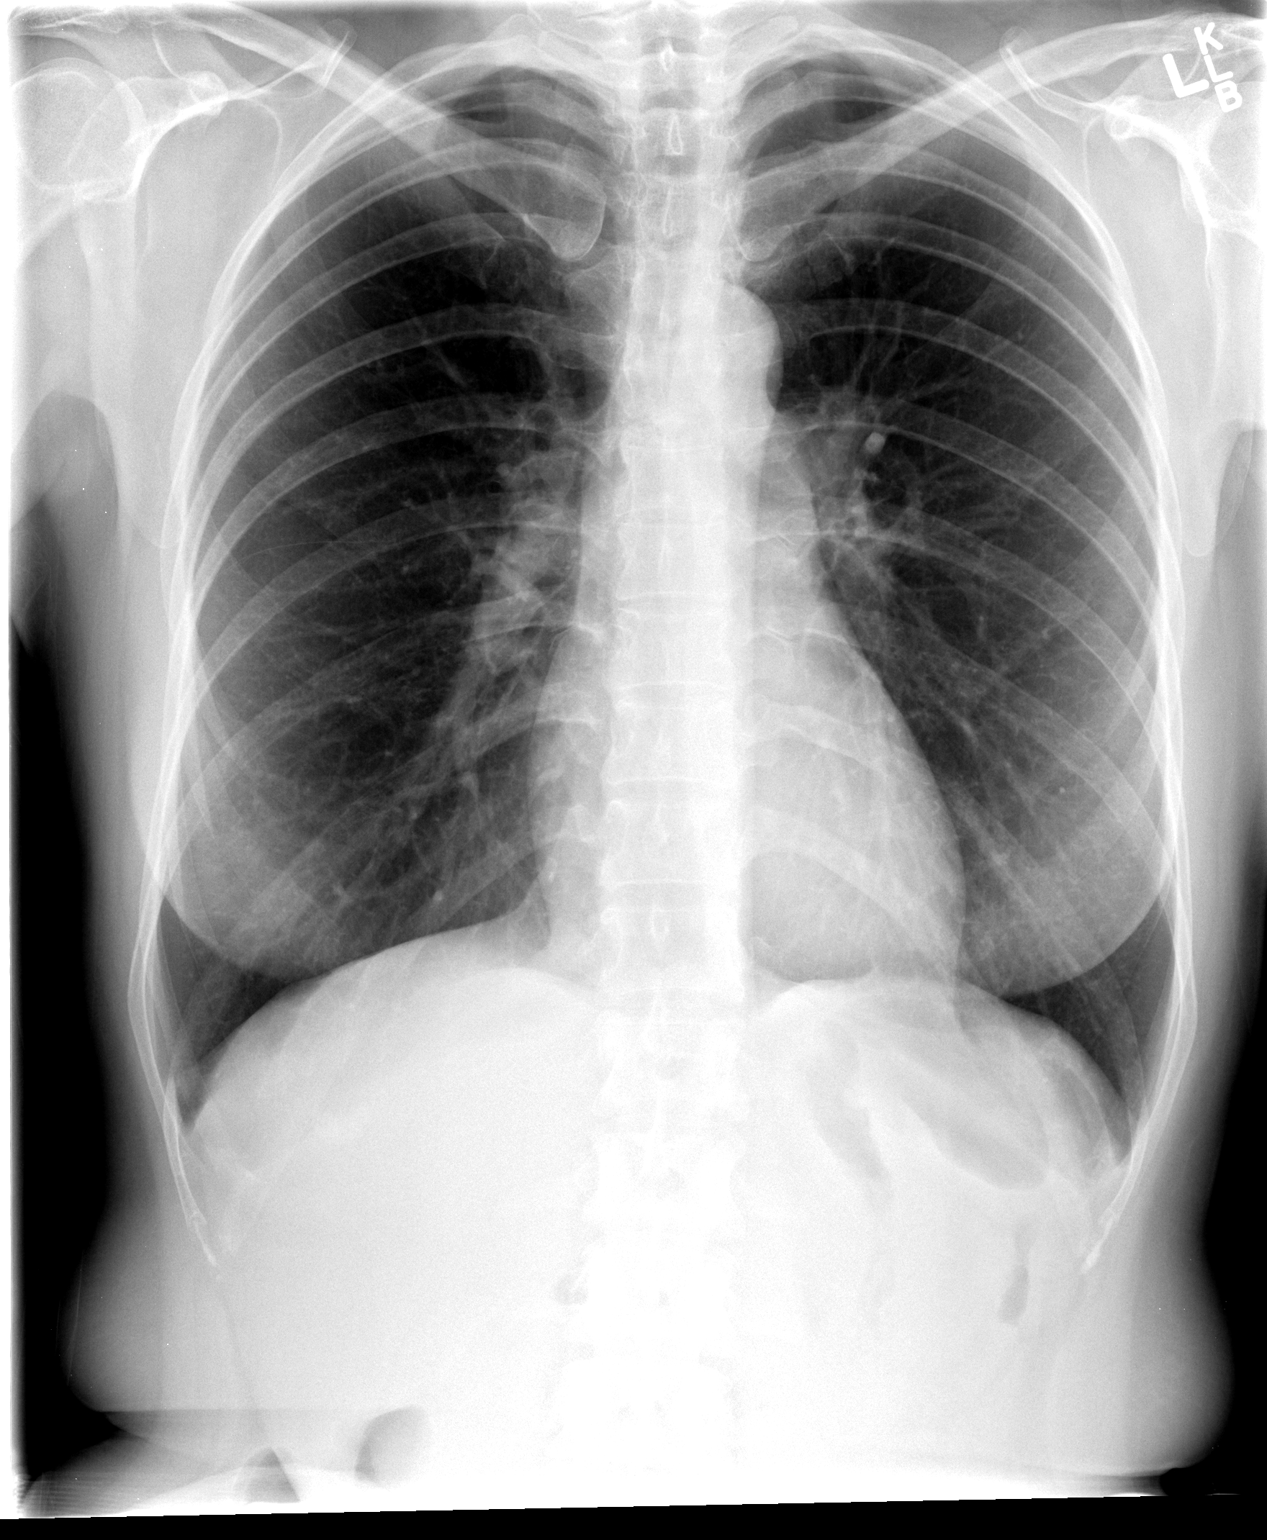

[view not recorded (2 of 2)]
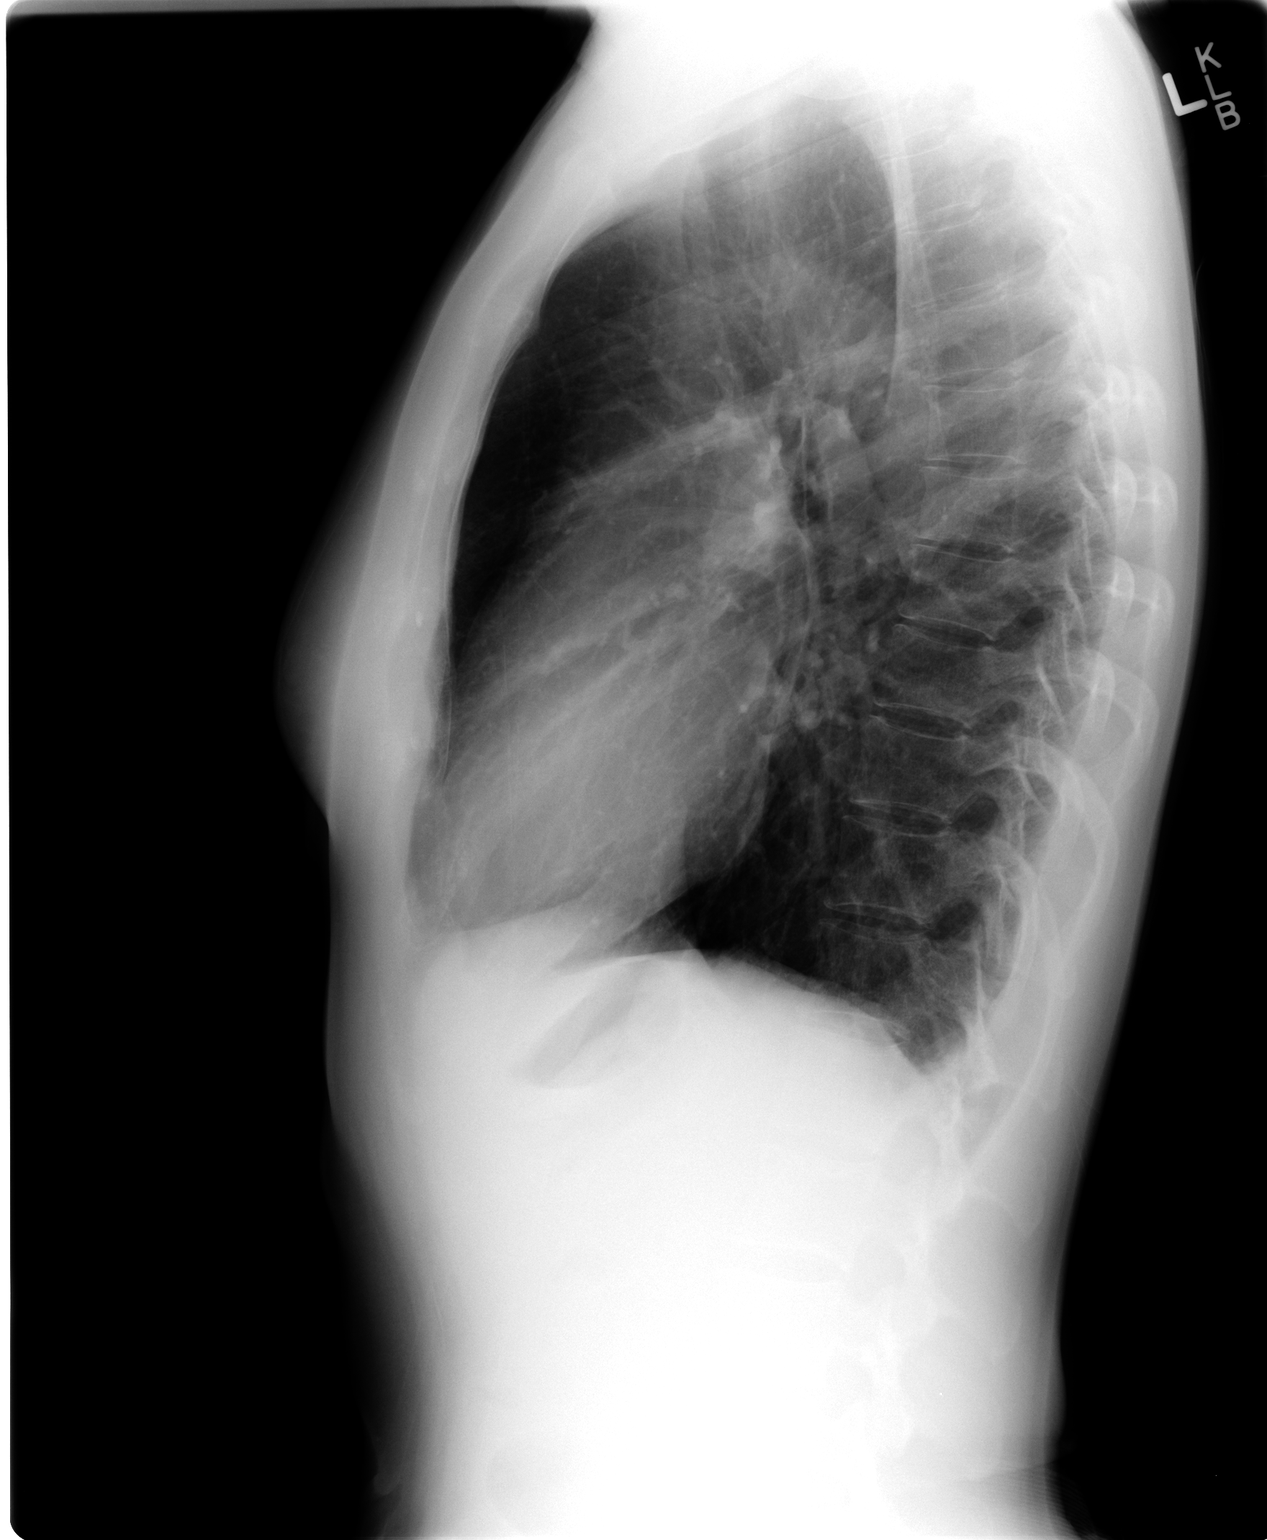

[2 of 2 positions shown; findings below may reference images not displayed]

FINDINGS: Lungs are clear. No focal consolidation. No pleural effusion or
pneumothorax.

The heart is normal in size.

Visualized osseous structures are within normal limits.
IMPRESSION: No evidence of acute cardiopulmonary disease.

## 2015-10-30 ENCOUNTER — Encounter: Payer: Self-pay | Admitting: Gynecology

## 2016-09-11 ENCOUNTER — Ambulatory Visit: Payer: BLUE CROSS/BLUE SHIELD | Admitting: Gynecology

## 2016-11-10 ENCOUNTER — Ambulatory Visit (INDEPENDENT_AMBULATORY_CARE_PROVIDER_SITE_OTHER): Payer: BLUE CROSS/BLUE SHIELD | Admitting: Gynecology

## 2016-11-10 ENCOUNTER — Encounter: Payer: Self-pay | Admitting: Gynecology

## 2016-11-10 VITALS — BP 110/80 | Ht 61.0 in | Wt 134.0 lb

## 2016-11-10 DIAGNOSIS — F419 Anxiety disorder, unspecified: Secondary | ICD-10-CM

## 2016-11-10 DIAGNOSIS — R232 Flushing: Secondary | ICD-10-CM | POA: Diagnosis not present

## 2016-11-10 DIAGNOSIS — Z01411 Encounter for gynecological examination (general) (routine) with abnormal findings: Secondary | ICD-10-CM

## 2016-11-10 DIAGNOSIS — N644 Mastodynia: Secondary | ICD-10-CM | POA: Diagnosis not present

## 2016-11-10 DIAGNOSIS — M2550 Pain in unspecified joint: Secondary | ICD-10-CM | POA: Diagnosis not present

## 2016-11-10 LAB — COMPREHENSIVE METABOLIC PANEL
ALT: 11 U/L (ref 6–29)
AST: 16 U/L (ref 10–35)
Albumin: 4.3 g/dL (ref 3.6–5.1)
Alkaline Phosphatase: 54 U/L (ref 33–115)
BUN: 10 mg/dL (ref 7–25)
CO2: 24 mmol/L (ref 20–31)
Calcium: 9.5 mg/dL (ref 8.6–10.2)
Chloride: 105 mmol/L (ref 98–110)
Creat: 0.61 mg/dL (ref 0.50–1.10)
Glucose, Bld: 93 mg/dL (ref 65–99)
POTASSIUM: 4.6 mmol/L (ref 3.5–5.3)
Sodium: 139 mmol/L (ref 135–146)
Total Bilirubin: 0.8 mg/dL (ref 0.2–1.2)
Total Protein: 7.1 g/dL (ref 6.1–8.1)

## 2016-11-10 LAB — CBC WITH DIFFERENTIAL/PLATELET
Basophils Absolute: 63 cells/uL (ref 0–200)
Basophils Relative: 1 %
EOS PCT: 1 %
Eosinophils Absolute: 63 cells/uL (ref 15–500)
HEMATOCRIT: 39.5 % (ref 35.0–45.0)
Hemoglobin: 12.4 g/dL (ref 11.7–15.5)
LYMPHS ABS: 1323 {cells}/uL (ref 850–3900)
Lymphocytes Relative: 21 %
MCH: 26.4 pg — ABNORMAL LOW (ref 27.0–33.0)
MCHC: 31.4 g/dL — AB (ref 32.0–36.0)
MCV: 84 fL (ref 80.0–100.0)
MONO ABS: 441 {cells}/uL (ref 200–950)
MPV: 8.9 fL (ref 7.5–12.5)
Monocytes Relative: 7 %
NEUTROS PCT: 70 %
Neutro Abs: 4410 cells/uL (ref 1500–7800)
Platelets: 740 10*3/uL — ABNORMAL HIGH (ref 140–400)
RBC: 4.7 MIL/uL (ref 3.80–5.10)
RDW: 14.6 % (ref 11.0–15.0)
WBC: 6.3 10*3/uL (ref 3.8–10.8)

## 2016-11-10 LAB — LIPID PANEL
CHOL/HDL RATIO: 2.6 ratio (ref <5.0)
Cholesterol: 154 mg/dL (ref <200)
HDL: 59 mg/dL (ref >50)
LDL CALC: 83 mg/dL (ref <100)
Triglycerides: 58 mg/dL (ref <150)
VLDL: 12 mg/dL (ref <30)

## 2016-11-10 LAB — TSH: TSH: 0.76 m[IU]/L

## 2016-11-10 MED ORDER — ALPRAZOLAM 0.25 MG PO TABS: 0.2500 mg | ORAL_TABLET | Freq: Every evening | ORAL | 1 refills | Status: DC | PRN

## 2016-11-10 NOTE — Progress Notes (Signed)
Amanda Harris October 14, 1965 [id number]   History:    51 y.o.  for annual gyn exam with several complaints today. Patient recently had been complaining some joint stiffness as the day progresses especially on her hands and her knees. 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 Patient had a normal colonoscopy in 2014. Patient with history of essential thrombocytosis who is currently being followed by hematologist oncologist at [facility] Dr. 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 Patient has had history vitamin D deficiency in the past and she's currently taking vitamin D 2000 units daily. She is also been treated in the past for H. Pylori.  The patient's mother and sister both had lung cancer and they were nonsmoker. Last chest x-ray done here was in 2009. November 2002 she had a Pap smear with ascus and ever since then her Pap smears been normal and her last Pap smear was in 2014.Patient had been evaluated by urologist in 2012 and passed a right kidney stone spontaneously.   Patient is also severing from much anxiety because of fear of her visa in this country will expire next year.  She had upper endoscopy in 2017 and colonoscopy in 2014.  Past medical history,surgical history, family history and social history were all reviewed and documented in the EPIC chart.  Gynecologic History Patient's last menstrual period was 10/19/2016. Contraception: tubal ligation Last Pap: 2014. Results were: normal Last mammogram: 2017. Results were: normal  Obstetric History OB History  Gravida Para Term Preterm AB Living  3 3 3     3   SAB TAB Ectopic Multiple Live Births          3    # Outcome Date GA  Lbr Len/2nd Weight Sex Delivery Anes PTL Lv  3 Term     M Vag-Spont   LIV  2 Term     F Vag-Spont   LIV  1 Term     M Vag-Spont   LIV       ROS: A ROS was performed and pertinent positives and negatives are included in the history.  GENERAL: No fevers or chills. HEENT: No change in vision, no earache, sore throat or sinus congestion. NECK: No pain or stiffness. CARDIOVASCULAR: No chest pain or pressure. No palpitations. PULMONARY: No shortness of breath, cough or wheeze. GASTROINTESTINAL: No abdominal pain, nausea, vomiting or diarrhea, melena or bright red blood per rectum. GENITOURINARY: No urinary frequency, urgency, hesitancy or dysuria. MUSCULOSKELETAL: No joint or muscle pain, no back pain, no recent trauma. DERMATOLOGIC: No rash, no itching, no lesions. ENDOCRINE: No polyuria, polydipsia, no heat or cold intolerance. No recent change in weight. HEMATOLOGICAL: No anemia or easy bruising or bleeding. NEUROLOGIC: No headache, seizures, numbness, tingling or weakness. PSYCHIATRIC: No depression, no loss of interest in normal activity or change in sleep pattern.     Exam: chaperone present  BP 110/80   Ht 5\' 1"  (1.549 m)   Wt 134 lb (60.8 kg)   LMP 10/19/2016   BMI 25.32 kg/m   Body mass index is 25.32 kg/m.  General appearance : Well developed well nourished female. No acute distress HEENT: Eyes: no retinal hemorrhage or exudates,  Neck supple, trachea midline, no carotid bruits, no thyroidmegaly  Lungs: Clear to auscultation, no rhonchi or wheezes, or rib retractions  Heart: Regular rate and rhythm, no murmurs or gallops Breast:Examined in sitting and supine position were symmetrical in appearance, no palpable masses or tenderness,  no skin retraction, no nipple inversion, no nipple discharge, no skin discoloration, no axillary or supraclavicular lymphadenopathy Abdomen: no palpable masses or tenderness, no rebound or guarding Extremities: no edema or skin discoloration or  tenderness  Pelvic:  Bartholin, Urethra, Skene Glands: Within normal limits             Vagina: No gross lesions or discharge  Cervix: No gross lesions or discharge  Uterus  anteverted, normal size, shape and consistency, non-tender and mobile  Adnexa  Without masses or tenderness  Anus and perineum  normal   Rectovaginal  normal sphincter tone without palpated masses or tenderness             Hemoccult not done     Assessment/Plan:  51 y.o. female for annual exam after reviewing rheumatoid arthritis versus osteoarthritis with the patient we are going to check a rheumatoid factor because of her symptoms. She denied any photosensitivity or any malar rash. For her anxiety she is going to be prescribed Xanax 0.25 mg to take 1 by mouth every 4-6 hours when necessary because of her vasomotor symptoms and starting to skip cycles she could be perimenopausal and for this reason were going to check her [facility]. We discussed the menopause and hormone replacement therapy. We'll going to give her literature information to start reading on the menopause as well as on hormone replacement therapy. Patient also been complaining of breast tenderness for many years she does not consume caffeine products a requisition was provided her to schedule her mammogram. Pap smear was done today with HPV screen. The following fasting blood work was ordered comprehensive metabolic panel, fasting lipid profile, TSH, CBC, and urinalysis. We discussed importance of calcium vitamin D and weightbearing exercises for osteoporosis prevention. We discussed importance of monthly breast exam.  Additional 10 minutes was spent discussing hormone replacement therapy, the menopause, as well as rheumatoid arthritis   Terrance Mass MD, 1:01 PM 11/10/2016

## 2016-11-10 NOTE — Patient Instructions (Addendum)
Sensibilidad en las mamas (Breast Tenderness) La sensibilidad en las mamas es un problema frecuente en las mujeres de todas las edades. y puede causar molestias leves o dolor intenso. Sus causas son variadas. Su mdico determinar la causa probable de la sensibilidad Civil Service fast streamer examen de las [place], las preguntas [employer] sntomas y la indicacin de algunos estudios. Por lo general, la sensibilidad en las mamas no significa que tenga cncer de mama. INSTRUCCIONES PARA EL CUIDADO EN EL HOGAR  A menudo, la sensibilidad en las mamas puede tratarse en Engineer, mining. Puede intentar lo siguiente:  Probarse un nuevo sostn que le brinde ms sujecin, especialmente mientras hace actividad fsica.  Usar un sostn con mejor sujecin o uno deportivo mientras duerme cuando las mamas estn muy sensibles.  Si tiene una lesin mamaria, aplique hielo en la zona:  Ponga el hielo en una bolsa plstica.  Colquese una toalla entre la piel y la bolsa de hielo.  Deje el hielo durante 20 minutos y aplquelo 2 a 3 veces por da.  Si tiene las [employer] [place] debido a la Transport planner, intente lo siguiente:  Extrigase [employer] o con un sacaleche.  Aplquese una compresa tibia en las mamas para ayudar a Transport planner.  Tome analgsicos de venta libre si su mdico lo autoriza.  Tome otros medicamentos que su mdico le recete, entre ellos, antibiticos o anticonceptivos. A largo plazo, puede aliviar la sensibilidad en las mamas si hace lo siguiente:  Disminuye el consumo de cafena.  Disminuye la cantidad de grasa de la dieta. Lleva un registro de los das y las horas cuando tiene mayor sensibilidad en las [place]. Esto ser de ayuda para que usted y su mdico encuentren la causa de la sensibilidad y cmo Solicitor. Adems, aprenda cmo examinarse las mamas en casa. Esto la ayudar a palpar un crecimiento o un bulto fuera de lo normal que podra causar la sensibilidad. SOLICITE ATENCIN MDICA SI:    Cualquier zona de la mama est dura, enrojecida y caliente al tacto. Puede ser un signo de infeccin.  Hay secrecin de los pezones (y no est amamantando). En especial, vigile la secrecin de sangre o pus.  Tiene fiebre, adems de sensibilidad en las mamas.  Tiene un bulto nuevo o doloroso en la mama que no desaparece despus de la finalizacin del perodo menstrual.  Ha intentando controlar el dolor en casa, pero no desaparece.  El dolor de la mama es ms intenso o le dificulta hacer las cosas que hace habitualmente durante el da. Esta informacin no tiene Marine scientist el consejo del mdico. Asegrese de hacerle al mdico cualquier pregunta que tenga. Document Released: 07/20/2013 Elsevier Interactive Patient Education  2017 [place] de ansiedad generalizada (Generalized Anxiety Disorder) El trastorno de ansiedad generalizada es un trastorno mental. Interfiere en las funciones vitales, incluyendo las [place], el trabajo y la escuela.  Es diferente de la ansiedad normal que todas las personas experimentan en algn momento de su vida en respuesta a sucesos y Chief Executive Officer. En verdad, la ansiedad normal nos ayuda a prepararnos y Brewing technologist acontecimientos y actividades de la vida. La ansiedad normal desaparece despus de que el evento o la actividad ha finalizado.  El trastorno de ansiedad generalizada no est necesariamente relacionada con eventos o actividades especficas. Tambin causa un exceso de ansiedad en proporcin a sucesos o actividades especficas. En este trastorno la ansiedad es difcil de Chief Technology Officer. Los sntomas pueden variar de leves a muy graves. [employer]  que sufren de trastorno de ansiedad generalizada pueden tener intensas olas de ansiedad con sntomas fsicos (ataques de pnico).  SNTOMAS  La ansiedad y la preocupacin asociada a este trastorno son difciles de Chief Technology Officer. Esta ansiedad y la preocupacin estn relacionados con  muchos eventos de la vida y sus actividades y tambin ocurre durante ms [employer] que no ocurre, durante 6 meses o ms. Las personas que la sufren pueden tener tres o ms de los siguientes sntomas (uno o ms en los nios):   Customer service manager.  Dificultades de concentracin.   Irritabilidad.  Tensin muscular  Dificultad para dormirse o sueo poco satisfactorio. DIAGNSTICO  Se diagnostica a travs de una evaluacin realizada por el mdico. El mdico le har preguntas acerca de su estado de nimo, sntomas fsicos y sucesos de [place] vida. Le har preguntas sobre su historia clnica, el consumo de alcohol o drogas, incluyendo los medicamentos recetados. [employer] un examen fsico e indicar anlisis de [place]. Ciertas enfermedades y el uso de determinadas sustancias pueden causar sntomas similares a este trastorno. Su mdico lo puede derivar a Teaching laboratory technician en salud mental para una evaluacin ms profunda.Belva Crome  Las terapias siguientes se utilizan en el tratamiento de este trastorno:   Medicamentos - Se recetan antidepresivos para el control diario a Barrister's clerk. Pueden indicarse tambin medicamentos para combatir la [place] graves, especialmente cuando ocurren ataques de pnico.   Terapia conversada (psicoterapia) Ciertos tipos de psicoterapia pueden ser tiles en el tratamiento del trastorno de ansiedad generalizada, proporcionando apoyo, educacin y Fish farm manager. Una forma de psicoterapia llamada terapia cognitivo-conductual puede ensearle formas saludables de pensar y Firefighter a los eventos y actividades de la vida diaria.  Tcnicasde manejo del estrs- Estas tcnicas incluyen el yoga, la meditacin y el ejercicio y pueden ser muy tiles cuando se practican con regularidad. Un especialista en salud mental puede ayudar a determinar qu tratamiento es mejor para usted. Algunas personas obtienen mejora con una terapia. Sin embargo, Producer, television/film/video  requieren una combinacin de terapias.  Esta informacin no tiene Marine scientist el consejo del mdico. Asegrese de hacerle al mdico cualquier pregunta que tenga. Document Released: 01/24/2013 Document Revised: 10/20/2014 Elsevier Interactive Patient Education  2017 [place] reumatoide (Rheumatoid Arthritis) La artritis reumatoide (AR) es una enfermedad a largo plazo (crnica) que causa inflamacin en las articulaciones. La AR puede comenzar lentamente. A menudo, afecta las articulaciones [place] y los pies. Por lo general, se ven afectadas las mismas articulaciones en ambos lados del cuerpo. La inflamacin por AR tambin podra afectar otras partes del cuerpo, como el corazn, los ojos o los pulmones. La AR es una enfermedad autoinmunitaria. Esto significa que el sistema de defensa (sistema inmunitario) de su cuerpo ataca por error tejidos corporales sanos. La AR no tiene [place], Armed forces training and education officer existen medicamentos que pueden ayudar a Public house manager los sntomas y Scientist, water quality o Research scientist (life sciences) avance de la enfermedad. CAUSAS Se desconoce la causa exacta de la AR. FACTORES DE RIESGO Es ms probable que esta afeccin se manifieste en: Mujeres. Personas con antecedentes familiares de AR u otras enfermedades autoinmunitarias. SNTOMAS Los sntomas de esta afeccin varan de Ardelia Mems persona a [place]. A menudo, los sntomas empiezan de manera gradual. Suelen ser peor por la [employer]. El primer sntoma podra ser entumecimiento matinal que se prolonga por ms de 67minutos. A medida que la AR avanza, los sntomas pueden incluir los siguientes: dolor, entumecimiento, hinchazn, sensacin de calor y  dolor a la palpacin en las articulaciones de ambos lados del cuerpo. Prdida de energa. Prdida del apetito. Prdida de peso. Fiebre no muy elevada. Ojos y boca secos. Bultos firmes (ndulos reumatoideos) que crecen por debajo de la piel en zonas como los huesos de los antebrazos, cerca de los codos  y [place]. Cambios en la apariencia de las articulaciones (deformidades) y prdida de la funcin articular. Los sntomas de la AR suelen ser intermitentes. En ocasiones, los sntomas empeoran durante un perodo de [place]. Estos perodos se denominan crisis. DIAGNSTICO Esta afeccin se diagnostica en funcin de los sntomas, los antecedentes mdicos y un examen fsico. Puede realizarse radiografas o resonancias magnticas para ver el tipo de cambio en las articulaciones que la AR est ocasionando. Tambin pueden hacerle un anlisis de sangre para Hydrographic surveyor lo siguiente: Protenas (anticuerpos) que su sistema inmunitario podra generar si padece AR. Estas incluyen factores reumatoides (FR) y anticuerpos antipptidos cclicos citrulinados. Cuando en los anlisis de sangre se detectan estas protenas, se dice que usted padece "artritis reumatoide seropositiva". Si en los anlisis de sangre no se detectan estas protenas, usted podra padecer "artritis reumatoide seronegativa". Inflamacin en la sangre. Recuento bajo de glbulos rojos (anemia). TRATAMIENTO Los [employer] del tratamiento son Best boy, reducir la inflamacin y disminuir o Ambulance person lesin en las articulaciones y la discapacidad. El tratamiento puede incluir lo siguiente: Cambios en el estilo de vida. Es Art gallery manager, llevar una dieta saludable y [place]. Medicamentos. El mdico puede ajustarle los medicamentos cada 31meses hasta tanto se alcancen los objetivos del [place]. Los medicamentos comunes incluyen los siguientes: Analgsicos. Corticoides y antiinflamatorios no esteroides (AINE) que reducen la inflamacin. Antirreumticos modificadores de la enfermedad (ARME) para disminuir el avance de la artritis reumatoidea. Modificadores de respuesta biolgica para reducir la inflamacin y las lesiones. Fisioterapia y [place] ocupacional. [place], en caso de que tenga daos articulares graves. Puede ser  necesario el reemplazo o fusin de las articulaciones. El mdico trabajar con usted para Pension scheme manager la opcin de tratamiento ms [place] para su caso en funcin de una evaluacin de la actividad general de la enfermedad en su organismo. [place] los medicamentos de venta libre y los recetados solamente como se lo haya indicado el mdico. Siga el programa de ejercicios tal como se lo haya indicado el mdico. Descanse cuando tenga una crisis. Reanude sus actividades normales como se lo haya indicado el mdico. Pregntele al mdico qu actividades son seguras para usted. Concurra a todas las visitas de control como se lo haya indicado el mdico. Esto es importante. SOLICITE ATENCIN MDICA SI: Wilma Flavin crisis de sntomas de AR. Tiene fiebre. Los [employer] causan efectos secundarios. SOLICITE ATENCIN MDICA DE INMEDIATO SI: Siente dolor en el pecho. Tiene dificultad para respirar. Desarrolla rpidamente calor y dolor en una articulacin, que son ms intensos que los dolores articulares habituales. PARA OBTENER MS [place] Lexicographer of Rheumatology): www.rheumatology.org. Fundacin contra la Artritis ([place]): www.arthritis.org Esta informacin no tiene Marine scientist el consejo del mdico. Asegrese de hacerle al mdico cualquier pregunta que tenga. Document Released: 07/09/2005 Document Revised: 01/21/2016 Document Reviewed: 07/12/2015 Elsevier Interactive Patient Education  2017 [place]. Menopause Menopause is the normal time of life when menstrual periods stop completely. Menopause is complete when you have missed 12 consecutive menstrual periods. It usually occurs between the ages of 74 years and 70 years. Very rarely does a woman develop menopause before the age  of 40 years. At menopause, your ovaries stop producing the female hormones estrogen and progesterone. This  can cause undesirable symptoms and also affect your health. Sometimes the symptoms may occur 4-5 years before the menopause begins. There is no relationship between menopause and: Oral contraceptives. Number of children you had. Race. The age your menstrual periods started (menarche). Heavy smokers and very thin women may develop menopause earlier in life. What are the causes? The ovaries stop producing the female hormones estrogen and progesterone. Other causes include: Surgery to remove both ovaries. The ovaries stop functioning for no known reason. Tumors of the pituitary gland in the brain. Medical disease that affects the ovaries and hormone production. Radiation treatment to the abdomen or pelvis. Chemotherapy that affects the ovaries. What are the signs or symptoms? Hot flashes. Night sweats. Decrease in sex drive. Vaginal dryness and thinning of the vagina causing painful intercourse. Dryness of the skin and developing wrinkles. Headaches. Tiredness. Irritability. Memory problems. Weight gain. Bladder infections. Hair growth of the face and chest. Infertility. More serious symptoms include: Loss of bone (osteoporosis) causing breaks (fractures). Depression. Hardening and narrowing of the arteries (atherosclerosis) causing heart attacks and strokes. How is this diagnosed? When the menstrual periods have stopped for 12 straight months. Physical exam. Hormone studies of the blood. How is this treated? There are many treatment choices and nearly as many questions about them. The decisions to treat or not to treat menopausal changes is an individual choice made with your health care provider. Your health care provider can discuss the treatments with you. Together, you can decide which treatment will work best for you. Your treatment choices may include: Hormone therapy (estrogen and progesterone). Non-hormonal medicines. Treating the individual symptoms with medicine (for  example antidepressants for depression). Herbal medicines that may help specific symptoms. Counseling by a psychiatrist or psychologist. Group therapy. Lifestyle changes including: Eating healthy. Regular exercise. Limiting caffeine and alcohol. Stress management and meditation. No treatment. Follow these instructions at home: Take the medicine your health care provider gives you as directed. Get plenty of sleep and rest. Exercise regularly. Eat a diet that contains calcium (good for the bones) and soy products (acts like estrogen hormone). Avoid alcoholic beverages. Do not smoke. If you have hot flashes, dress in layers. Take supplements, calcium, and vitamin D to strengthen bones. You can use over-the-counter lubricants or moisturizers for vaginal dryness. Group therapy is sometimes very helpful. Acupuncture may be helpful in some cases. Contact a health care provider if: You are not sure you are in menopause. You are having menopausal symptoms and need advice and treatment. You are still having menstrual periods after age 31 years. You have pain with intercourse. Menopause is complete (no menstrual period for 12 months) and you develop vaginal bleeding. You need a referral to a specialist (gynecologist, psychiatrist, or psychologist) for treatment. Get help right away if: You have severe depression. You have excessive vaginal bleeding. You fell and think you have a broken bone. You have pain when you urinate. You develop leg or chest pain. You have a fast pounding heart beat (palpitations). You have severe headaches. You develop vision problems. You feel a lump in your breast. You have abdominal pain or severe indigestion. This information is not intended to replace advice given to you by your health care provider. Make sure you discuss any questions you have with your health care provider. Document Released: 12/20/2003 Document Revised: 03/06/2016 Document Reviewed:  04/28/2013 Elsevier Interactive Patient Education  2017 Elsevier  Inc.  

## 2016-11-11 LAB — FOLLICLE STIMULATING HORMONE: FSH: 4.9 m[IU]/mL

## 2016-11-11 LAB — URINALYSIS W MICROSCOPIC + REFLEX CULTURE
BACTERIA UA: NONE SEEN [HPF]
Bilirubin Urine: NEGATIVE
CASTS: NONE SEEN [LPF]
Crystals: NONE SEEN [HPF]
Glucose, UA: NEGATIVE
Hgb urine dipstick: NEGATIVE
LEUKOCYTES UA: NEGATIVE
Nitrite: NEGATIVE
PH: 8 (ref 5.0–8.0)
Specific Gravity, Urine: 1.026 (ref 1.001–1.035)
Yeast: NONE SEEN [HPF]

## 2016-11-11 LAB — PAP, TP IMAGING W/ HPV RNA, RFLX HPV TYPE 16,18/45: HPV MRNA, HIGH RISK: NOT DETECTED

## 2016-11-11 LAB — RHEUMATOID FACTOR: Rhuematoid fact SerPl-aCnc: <14 IU/mL (ref <14)

## 2016-11-12 LAB — URINE CULTURE: Organism ID, Bacteria: NO GROWTH

## 2016-12-09 ENCOUNTER — Telehealth: Payer: Self-pay | Admitting: Gynecology

## 2016-12-09 NOTE — Telephone Encounter (Signed)
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 Patient had a normal colonoscopy in 2014. 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 I reviewed her last medical oncology note dating back April 2017 (which has been the last time patient saw them) and they had stated the following:  "She has a hx of having a high platelet count since at least age 51. She has had no thrombotic complications to date. She was placed on anagrelide in the setting of a platelet count of 698 in the past. She stopped anagrelide due to a change in her health insurance resulting in a change in her oncologist. She was first evaluated by Dr. Rudean Hitt in Jan 2014 at which time she was complaining of fatigue and dizziness in the setting of a plt count of 969. 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 Because it was felt that her fatigue and dizziness might be due to her elevated plt count, she was initiated on PEG-IFN 44mg sq weekly in May 2014. She had to hold the PEG in June 2014 due to elevated LFTs. Her LFTs normalized after PEG was held and restarted at a dose of 477m sq q2weeks on 04/18/13. She has been on this dose ever since. She reports mild flu like symptoms for 3 days after she takes her PEG injection but otherwise has tolerated it well.Pt stopped PEG-IFN injections on her own in Oct 2016 because she was  bothered by the flu-like symptoms she was experiencing for a few days after each injection and because she did not feel like it was helping the fatigue and dizziness that was felt to be possibly be related to her ET that prompted its initiation to begin with. She has continued to take a baby aspirin a day. She also started taking ferrous sulfate once a day b/c she says her hb was low at some point when it was checked locally in the last 6 months. She does not think iron studies were checked. It sounds like she decided to start ferrous sulfate on her own, and she feels her energy level has improved since she started oral iron. She has also started taking a tablet containing tomato concentrate/resvida trans resveratrol that she states she started taking for her platelet count. She denies fevers, chills or night sweats. No recent infections. No headache, major changes in vision, focal numbness/tingling or weakness."  I related all the above information to the patient and she will make an appointment to see them this week to start her back on her medication. She will continue on her baby aspirin 81 mg daily.

## 2017-02-25 ENCOUNTER — Encounter: Payer: Self-pay | Admitting: Gynecology

## 2018-05-11 ENCOUNTER — Other Ambulatory Visit: Payer: Self-pay | Admitting: Orthopedic Surgery

## 2018-06-29 ENCOUNTER — Other Ambulatory Visit: Payer: Self-pay | Admitting: Obstetrics and Gynecology

## 2018-06-29 ENCOUNTER — Other Ambulatory Visit (HOSPITAL_COMMUNITY)
Admission: RE | Admit: 2018-06-29 | Discharge: 2018-06-29 | Disposition: A | Discharge status: Home / Self Care | Payer: BLUE CROSS/BLUE SHIELD | Source: Ambulatory Visit | Attending: Obstetrics and Gynecology | Admitting: Obstetrics and Gynecology

## 2018-06-29 DIAGNOSIS — Z01411 Encounter for gynecological examination (general) (routine) with abnormal findings: Secondary | ICD-10-CM | POA: Insufficient documentation

## 2018-07-05 LAB — CYTOLOGY - PAP: HPV (WINDOPATH): NOT DETECTED

## 2018-07-12 ENCOUNTER — Other Ambulatory Visit: Payer: Self-pay | Admitting: Obstetrics and Gynecology

## 2018-07-12 DIAGNOSIS — Z1231 Encounter for screening mammogram for malignant neoplasm of breast: Secondary | ICD-10-CM

## 2018-07-19 ENCOUNTER — Ambulatory Visit
Admission: RE | Admit: 2018-07-19 | Discharge: 2018-07-19 | Disposition: A | Discharge status: Home / Self Care | Payer: BLUE CROSS/BLUE SHIELD | Source: Ambulatory Visit | Attending: Obstetrics and Gynecology | Admitting: Obstetrics and Gynecology

## 2018-07-19 DIAGNOSIS — Z1231 Encounter for screening mammogram for malignant neoplasm of breast: Secondary | ICD-10-CM

## 2019-09-16 ENCOUNTER — Other Ambulatory Visit: Payer: Self-pay

## 2019-09-16 DIAGNOSIS — Z20822 Contact with and (suspected) exposure to covid-19: Secondary | ICD-10-CM

## 2019-09-20 LAB — NOVEL CORONAVIRUS, NAA: SARS-CoV-2, NAA: NOT DETECTED

## 2019-10-04 ENCOUNTER — Ambulatory Visit: Payer: BC Managed Care – PPO | Attending: Internal Medicine

## 2019-10-04 DIAGNOSIS — Z20822 Contact with and (suspected) exposure to covid-19: Secondary | ICD-10-CM

## 2019-10-07 LAB — NOVEL CORONAVIRUS, NAA: SARS-CoV-2, NAA: NOT DETECTED

## 2020-01-20 DIAGNOSIS — Z01411 Encounter for gynecological examination (general) (routine) with abnormal findings: Secondary | ICD-10-CM | POA: Diagnosis not present

## 2020-01-20 DIAGNOSIS — Z13220 Encounter for screening for lipoid disorders: Secondary | ICD-10-CM | POA: Diagnosis not present

## 2020-01-20 DIAGNOSIS — Z01419 Encounter for gynecological examination (general) (routine) without abnormal findings: Secondary | ICD-10-CM | POA: Diagnosis not present

## 2020-01-20 DIAGNOSIS — Z131 Encounter for screening for diabetes mellitus: Secondary | ICD-10-CM | POA: Diagnosis not present

## 2020-01-20 DIAGNOSIS — R232 Flushing: Secondary | ICD-10-CM | POA: Diagnosis not present

## 2020-01-20 DIAGNOSIS — Z1321 Encounter for screening for nutritional disorder: Secondary | ICD-10-CM | POA: Diagnosis not present

## 2020-01-20 DIAGNOSIS — N951 Menopausal and female climacteric states: Secondary | ICD-10-CM | POA: Diagnosis not present

## 2020-01-20 DIAGNOSIS — R300 Dysuria: Secondary | ICD-10-CM | POA: Diagnosis not present

## 2020-03-26 DIAGNOSIS — Z7982 Long term (current) use of aspirin: Secondary | ICD-10-CM | POA: Diagnosis not present

## 2020-03-26 DIAGNOSIS — D473 Essential (hemorrhagic) thrombocythemia: Secondary | ICD-10-CM | POA: Diagnosis not present

## 2020-04-02 DIAGNOSIS — H11041 Peripheral pterygium, stationary, right eye: Secondary | ICD-10-CM | POA: Diagnosis not present

## 2020-04-02 DIAGNOSIS — H11052 Peripheral pterygium, progressive, left eye: Secondary | ICD-10-CM | POA: Diagnosis not present

## 2020-04-02 DIAGNOSIS — H2513 Age-related nuclear cataract, bilateral: Secondary | ICD-10-CM | POA: Diagnosis not present

## 2020-04-02 DIAGNOSIS — H04123 Dry eye syndrome of bilateral lacrimal glands: Secondary | ICD-10-CM | POA: Diagnosis not present

## 2020-07-23 DIAGNOSIS — H04123 Dry eye syndrome of bilateral lacrimal glands: Secondary | ICD-10-CM | POA: Diagnosis not present

## 2020-07-23 DIAGNOSIS — H11041 Peripheral pterygium, stationary, right eye: Secondary | ICD-10-CM | POA: Diagnosis not present

## 2020-07-23 DIAGNOSIS — H11052 Peripheral pterygium, progressive, left eye: Secondary | ICD-10-CM | POA: Diagnosis not present

## 2020-07-23 DIAGNOSIS — H2513 Age-related nuclear cataract, bilateral: Secondary | ICD-10-CM | POA: Diagnosis not present

## 2020-08-28 DIAGNOSIS — H11052 Peripheral pterygium, progressive, left eye: Secondary | ICD-10-CM | POA: Diagnosis not present

## 2020-10-16 ENCOUNTER — Other Ambulatory Visit: Payer: Self-pay

## 2020-10-16 DIAGNOSIS — Z20822 Contact with and (suspected) exposure to covid-19: Secondary | ICD-10-CM

## 2020-10-22 LAB — NOVEL CORONAVIRUS, NAA: SARS-CoV-2, NAA: NOT DETECTED

## 2020-10-22 LAB — SARS-COV-2, NAA 2 DAY TAT

## 2020-12-31 DIAGNOSIS — H11052 Peripheral pterygium, progressive, left eye: Secondary | ICD-10-CM | POA: Diagnosis not present

## 2020-12-31 DIAGNOSIS — H11041 Peripheral pterygium, stationary, right eye: Secondary | ICD-10-CM | POA: Diagnosis not present

## 2020-12-31 DIAGNOSIS — H04123 Dry eye syndrome of bilateral lacrimal glands: Secondary | ICD-10-CM | POA: Diagnosis not present

## 2020-12-31 DIAGNOSIS — H2513 Age-related nuclear cataract, bilateral: Secondary | ICD-10-CM | POA: Diagnosis not present

## 2021-01-18 DIAGNOSIS — Z1231 Encounter for screening mammogram for malignant neoplasm of breast: Secondary | ICD-10-CM | POA: Diagnosis not present

## 2021-06-18 DIAGNOSIS — Z01419 Encounter for gynecological examination (general) (routine) without abnormal findings: Secondary | ICD-10-CM | POA: Diagnosis not present

## 2021-06-18 DIAGNOSIS — Z1151 Encounter for screening for human papillomavirus (HPV): Secondary | ICD-10-CM | POA: Diagnosis not present

## 2021-06-18 DIAGNOSIS — Z01411 Encounter for gynecological examination (general) (routine) with abnormal findings: Secondary | ICD-10-CM | POA: Diagnosis not present

## 2021-06-18 DIAGNOSIS — Z1231 Encounter for screening mammogram for malignant neoplasm of breast: Secondary | ICD-10-CM | POA: Diagnosis not present

## 2021-06-18 DIAGNOSIS — R3915 Urgency of urination: Secondary | ICD-10-CM | POA: Diagnosis not present

## 2021-06-18 DIAGNOSIS — Z1211 Encounter for screening for malignant neoplasm of colon: Secondary | ICD-10-CM | POA: Diagnosis not present

## 2021-07-10 DIAGNOSIS — K5900 Constipation, unspecified: Secondary | ICD-10-CM | POA: Diagnosis not present

## 2021-07-10 DIAGNOSIS — D3A026 Benign carcinoid tumor of the rectum: Secondary | ICD-10-CM | POA: Diagnosis not present

## 2021-07-10 DIAGNOSIS — R1012 Left upper quadrant pain: Secondary | ICD-10-CM | POA: Diagnosis not present

## 2021-07-10 DIAGNOSIS — Z1211 Encounter for screening for malignant neoplasm of colon: Secondary | ICD-10-CM | POA: Diagnosis not present

## 2021-07-12 DIAGNOSIS — R109 Unspecified abdominal pain: Secondary | ICD-10-CM | POA: Diagnosis not present

## 2021-07-12 DIAGNOSIS — Z8719 Personal history of other diseases of the digestive system: Secondary | ICD-10-CM | POA: Diagnosis not present

## 2021-07-12 DIAGNOSIS — Z1211 Encounter for screening for malignant neoplasm of colon: Secondary | ICD-10-CM | POA: Diagnosis not present

## 2021-07-12 DIAGNOSIS — Z85040 Personal history of malignant carcinoid tumor of rectum: Secondary | ICD-10-CM | POA: Diagnosis not present

## 2021-07-12 DIAGNOSIS — K648 Other hemorrhoids: Secondary | ICD-10-CM | POA: Diagnosis not present

## 2021-07-19 DIAGNOSIS — M67911 Unspecified disorder of synovium and tendon, right shoulder: Secondary | ICD-10-CM | POA: Diagnosis not present

## 2021-07-22 DIAGNOSIS — M6281 Muscle weakness (generalized): Secondary | ICD-10-CM | POA: Diagnosis not present

## 2021-07-22 DIAGNOSIS — M7541 Impingement syndrome of right shoulder: Secondary | ICD-10-CM | POA: Diagnosis not present

## 2021-07-26 DIAGNOSIS — M6281 Muscle weakness (generalized): Secondary | ICD-10-CM | POA: Diagnosis not present

## 2021-07-26 DIAGNOSIS — M7541 Impingement syndrome of right shoulder: Secondary | ICD-10-CM | POA: Diagnosis not present

## 2021-07-29 DIAGNOSIS — M6281 Muscle weakness (generalized): Secondary | ICD-10-CM | POA: Diagnosis not present

## 2021-07-29 DIAGNOSIS — M7541 Impingement syndrome of right shoulder: Secondary | ICD-10-CM | POA: Diagnosis not present

## 2021-08-02 DIAGNOSIS — M7541 Impingement syndrome of right shoulder: Secondary | ICD-10-CM | POA: Diagnosis not present

## 2021-08-02 DIAGNOSIS — M6281 Muscle weakness (generalized): Secondary | ICD-10-CM | POA: Diagnosis not present

## 2021-08-05 DIAGNOSIS — M6281 Muscle weakness (generalized): Secondary | ICD-10-CM | POA: Diagnosis not present

## 2021-08-05 DIAGNOSIS — M7541 Impingement syndrome of right shoulder: Secondary | ICD-10-CM | POA: Diagnosis not present

## 2021-08-09 DIAGNOSIS — M6281 Muscle weakness (generalized): Secondary | ICD-10-CM | POA: Diagnosis not present

## 2021-08-09 DIAGNOSIS — M7541 Impingement syndrome of right shoulder: Secondary | ICD-10-CM | POA: Diagnosis not present

## 2021-08-12 DIAGNOSIS — M6281 Muscle weakness (generalized): Secondary | ICD-10-CM | POA: Diagnosis not present

## 2021-08-12 DIAGNOSIS — M7541 Impingement syndrome of right shoulder: Secondary | ICD-10-CM | POA: Diagnosis not present

## 2021-08-19 DIAGNOSIS — M7541 Impingement syndrome of right shoulder: Secondary | ICD-10-CM | POA: Diagnosis not present

## 2021-08-19 DIAGNOSIS — M6281 Muscle weakness (generalized): Secondary | ICD-10-CM | POA: Diagnosis not present

## 2021-08-23 DIAGNOSIS — M67911 Unspecified disorder of synovium and tendon, right shoulder: Secondary | ICD-10-CM | POA: Diagnosis not present

## 2021-09-11 DIAGNOSIS — G8929 Other chronic pain: Secondary | ICD-10-CM | POA: Diagnosis not present

## 2021-09-11 DIAGNOSIS — Z23 Encounter for immunization: Secondary | ICD-10-CM | POA: Diagnosis not present

## 2021-09-11 DIAGNOSIS — D473 Essential (hemorrhagic) thrombocythemia: Secondary | ICD-10-CM | POA: Diagnosis not present

## 2021-09-11 DIAGNOSIS — Z0000 Encounter for general adult medical examination without abnormal findings: Secondary | ICD-10-CM | POA: Diagnosis not present

## 2021-09-11 DIAGNOSIS — Z13220 Encounter for screening for lipoid disorders: Secondary | ICD-10-CM | POA: Diagnosis not present

## 2021-09-11 DIAGNOSIS — M25511 Pain in right shoulder: Secondary | ICD-10-CM | POA: Diagnosis not present

## 2021-09-11 DIAGNOSIS — R3915 Urgency of urination: Secondary | ICD-10-CM | POA: Diagnosis not present

## 2021-09-11 DIAGNOSIS — R748 Abnormal levels of other serum enzymes: Secondary | ICD-10-CM | POA: Diagnosis not present

## 2021-09-11 DIAGNOSIS — R0609 Other forms of dyspnea: Secondary | ICD-10-CM | POA: Diagnosis not present

## 2021-09-11 DIAGNOSIS — D75839 Thrombocytosis, unspecified: Secondary | ICD-10-CM | POA: Diagnosis not present

## 2021-09-12 DIAGNOSIS — M25511 Pain in right shoulder: Secondary | ICD-10-CM | POA: Diagnosis not present

## 2021-09-16 ENCOUNTER — Telehealth: Payer: Self-pay | Admitting: Hematology and Oncology

## 2021-09-16 NOTE — Telephone Encounter (Signed)
Scheduled appt per 12/2 referral. Pt is aware of appt date and time. Pt requested to schedule appt on 12/23.

## 2021-09-20 DIAGNOSIS — M75111 Incomplete rotator cuff tear or rupture of right shoulder, not specified as traumatic: Secondary | ICD-10-CM | POA: Diagnosis not present

## 2021-09-26 DIAGNOSIS — Z1211 Encounter for screening for malignant neoplasm of colon: Secondary | ICD-10-CM | POA: Insufficient documentation

## 2021-09-26 DIAGNOSIS — K219 Gastro-esophageal reflux disease without esophagitis: Secondary | ICD-10-CM | POA: Insufficient documentation

## 2021-09-26 DIAGNOSIS — K600 Acute anal fissure: Secondary | ICD-10-CM | POA: Insufficient documentation

## 2021-10-02 DIAGNOSIS — B169 Acute hepatitis B without delta-agent and without hepatic coma: Secondary | ICD-10-CM | POA: Diagnosis not present

## 2021-10-02 DIAGNOSIS — R748 Abnormal levels of other serum enzymes: Secondary | ICD-10-CM | POA: Diagnosis not present

## 2021-10-02 DIAGNOSIS — R768 Other specified abnormal immunological findings in serum: Secondary | ICD-10-CM | POA: Diagnosis not present

## 2021-10-02 DIAGNOSIS — R1010 Upper abdominal pain, unspecified: Secondary | ICD-10-CM | POA: Diagnosis not present

## 2021-10-03 ENCOUNTER — Other Ambulatory Visit: Payer: Self-pay | Admitting: Nurse Practitioner

## 2021-10-03 DIAGNOSIS — R748 Abnormal levels of other serum enzymes: Secondary | ICD-10-CM

## 2021-10-03 DIAGNOSIS — R1010 Upper abdominal pain, unspecified: Secondary | ICD-10-CM

## 2021-10-04 ENCOUNTER — Other Ambulatory Visit: Payer: Self-pay

## 2021-10-04 ENCOUNTER — Inpatient Hospital Stay: Payer: BC Managed Care – PPO

## 2021-10-04 ENCOUNTER — Inpatient Hospital Stay: Payer: BC Managed Care – PPO | Attending: Hematology and Oncology | Admitting: Hematology and Oncology

## 2021-10-04 VITALS — BP 114/51 | HR 67 | Temp 97.6°F | Resp 16 | Ht 61.0 in | Wt 141.5 lb

## 2021-10-04 DIAGNOSIS — D473 Essential (hemorrhagic) thrombocythemia: Secondary | ICD-10-CM | POA: Diagnosis not present

## 2021-10-04 DIAGNOSIS — Z7982 Long term (current) use of aspirin: Secondary | ICD-10-CM | POA: Diagnosis not present

## 2021-10-04 LAB — CMP (CANCER CENTER ONLY)
ALT: 15 U/L (ref 0–44)
AST: 17 U/L (ref 15–41)
Albumin: 4.6 g/dL (ref 3.5–5.0)
Alkaline Phosphatase: 70 U/L (ref 38–126)
Anion gap: 6 (ref 5–15)
BUN: 11 mg/dL (ref 6–20)
CO2: 30 mmol/L (ref 22–32)
Calcium: 10.4 mg/dL — ABNORMAL HIGH (ref 8.9–10.3)
Chloride: 104 mmol/L (ref 98–111)
Creatinine: 0.7 mg/dL (ref 0.44–1.00)
GFR, Estimated: >60 mL/min (ref >60)
Glucose, Bld: 97 mg/dL (ref 70–99)
Potassium: 5 mmol/L (ref 3.5–5.1)
Sodium: 140 mmol/L (ref 135–145)
Total Bilirubin: 0.7 mg/dL (ref 0.3–1.2)
Total Protein: 7.6 g/dL (ref 6.5–8.1)

## 2021-10-04 LAB — CBC WITH DIFFERENTIAL (CANCER CENTER ONLY)
Abs Immature Granulocytes: 0 10*3/uL (ref 0.00–0.07)
Basophils Absolute: 0.1 10*3/uL (ref 0.0–0.1)
Basophils Relative: 2 %
Eosinophils Absolute: 0.1 10*3/uL (ref 0.0–0.5)
Eosinophils Relative: 2 %
HCT: 41.7 % (ref 36.0–46.0)
Hemoglobin: 12.9 g/dL (ref 12.0–15.0)
Immature Granulocytes: 0 %
Lymphocytes Relative: 34 %
Lymphs Abs: 2.3 10*3/uL (ref 0.7–4.0)
MCH: 25.3 pg — ABNORMAL LOW (ref 26.0–34.0)
MCHC: 30.9 g/dL (ref 30.0–36.0)
MCV: 81.8 fL (ref 80.0–100.0)
Monocytes Absolute: 0.4 10*3/uL (ref 0.1–1.0)
Monocytes Relative: 6 %
Neutro Abs: 3.8 10*3/uL (ref 1.7–7.7)
Neutrophils Relative %: 56 %
Platelet Count: 1069 10*3/uL (ref 150–400)
RBC: 5.1 MIL/uL (ref 3.87–5.11)
RDW: 13.5 % (ref 11.5–15.5)
WBC Count: 6.9 10*3/uL (ref 4.0–10.5)
nRBC: 0 % (ref 0.0–0.2)

## 2021-10-04 NOTE — Progress Notes (Signed)
Critical lab value report from lab.  Pt's plt are 1,069 today.  Notified Dr. Lorenso Courier and Joesphine Bare, RN of the critical lab value.

## 2021-10-04 NOTE — Progress Notes (Signed)
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 Della Goo at [facility] 10/04/2021: establish care with Dr. Lorenso Courier    CHIEF COMPLAINTS/PURPOSE OF CONSULTATION:  "Essential Thrombocytosis "  HISTORY OF PRESENTING ILLNESS:  Amanda Harris 54 y.o. female with medical history significant for essential thrombocytosis, JAK2 V617 if positive who presents for establishing care.  On review of the previous records Amanda Harris was previously followed by Dr. Della Goo at [facility].  She established care on 03/26/2020.  At that time according to the records the patient had peripheral blood testing positive for JAK2 V617.  She underwent a bone marrow biopsy which showed multilineage hematopoiesis with hypocellularity but without any evidence of increased blasts.  There was no dysplasia seen, however megakaryocytes were increased in number with large branched megakaryocytes.  There is no evidence of fibrosis.  She had a platelet count of 969,000 and was started on treatment with anagrelide and then subsequently changed to interferon subcutaneous weekly.  She had difficulties with this medication including elevated liver function test, myalgia, fatigue, flulike symptoms.  This was discontinued in October 2016 and the patient was continued on aspirin therapy alone.  Around that time she established at [facility] and was determined to have a low risk essential thrombocythemia and therefore cytoreductive therapy was not pursued.  She is remained on low-dose aspirin since that time.   On exam today Amanda Harris reports that she has a known diagnosis of essential thrombocytosis.  She has a good understanding of this disease.  She  reports that she has undergone prior treatments including anagrelide and interferon alpha.  She notes that she really did not tolerate the interferon alfa well.  She has no personal history of blood clots, CVA, or MI.  She notes that she did develop elevated LFTs due to liver inflammation from the interferon alpha which is currently being evaluated.  She notes that she does have easy bruising but denies any bleeding.  Her last menstrual cycle was in February 2021.  She denies having any other focal symptoms at this time.  On further discussion her family history is remarkable for lung cancer in her mother, and lung cancer in her sister.  She notes that she has 3 healthy children.  She does not smoke, does not drink and currently works in Sales executive for [employer].  She otherwise denies any fevers, chills, sweats, nausea, vomiting or diarrhea.  A full 10 point ROS is listed below.  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 MANN   Kidney stones    Mastodynia    Myeloproliferative disease ([place])    JAK2 MUTATION ... FOLLOWED BY DR. NAGALLA AT [facility].   Thrombocytosis     SURGICAL HISTORY: Past Surgical History:  Procedure Laterality Date   COLONOSCOPY W/ BIOPSIES  APRIL 2010   LOW-GRADE NEUROENDOCRINE-CARCINOID TUMOR   TUBAL LIGATION     POST PARTUM    SOCIAL HISTORY: Social History   Socioeconomic History   Marital status: Married    Spouse name: Not on file  Number of children: Not on file   Years of education: Not on file   Highest education level: Not on file  Occupational History   Not on file  Tobacco Use   Smoking status: Never   Smokeless tobacco: Never  Substance and Sexual Activity   Alcohol use: No    Alcohol/week: 0.0 standard drinks   Drug use: No   Sexual activity: Yes     Comment: 1st intercourse- 89, partners- 1  Other Topics Concern   Not on file  Social History Narrative   ** Merged History Encounter **       ** Merged History Encounter **       Social Determinants of Radio broadcast assistant Strain: Not on file  Food Insecurity: Not on file  Transportation Needs: Not on file  Physical Activity: Not on file  Stress: Not on file  Social Connections: Not on file  Intimate Partner Violence: Not on file    FAMILY HISTORY: Family History  Problem Relation Age of Onset   Cancer Mother        LUNG (NON-SMOKER)   Cancer Sister        LUNG (NON-SMOKER)   Breast cancer Neg Hx     ALLERGIES:  has No Known Allergies.  MEDICATIONS:  Current Outpatient Medications  Medication Sig Dispense Refill   aspirin EC 81 MG tablet Take 81 mg by mouth daily.     CALCIUM PO Take 1 tablet by mouth daily.     Zinc 50 MG TABS Take by mouth.     No current facility-administered medications for this visit.    REVIEW OF SYSTEMS:   Constitutional: ( - ) fevers, ( - )  chills , ( - ) night sweats Eyes: ( - ) blurriness of vision, ( - ) double vision, ( - ) watery eyes Ears, nose, mouth, throat, and face: ( - ) mucositis, ( - ) sore throat Respiratory: ( - ) cough, ( - ) dyspnea, ( - ) wheezes Cardiovascular: ( - ) palpitation, ( - ) chest discomfort, ( - ) lower extremity swelling Gastrointestinal:  ( - ) nausea, ( - ) heartburn, ( - ) change in bowel habits Skin: ( - ) abnormal skin rashes Lymphatics: ( - ) new lymphadenopathy, ( - ) easy bruising Neurological: ( - ) numbness, ( - ) tingling, ( - ) new weaknesses Behavioral/Psych: ( - ) mood change, ( - ) new changes  All other systems were reviewed with the patient and are negative.  PHYSICAL EXAMINATION:  Vitals:   10/04/21 1328  BP: (!) 114/51  Pulse: 67  Resp: 16  Temp: 97.6 F (36.4 C)  SpO2: 99%   Filed Weights   10/04/21 1328  Weight: 141 lb 8 oz (64.2 kg)    GENERAL: well appearing  middle-aged female in NAD  SKIN: skin color, texture, turgor are normal, no rashes or significant lesions EYES: conjunctiva are pink and non-injected, sclera clear LUNGS: clear to auscultation and percussion with normal breathing effort HEART: regular rate & rhythm and no murmurs and no lower extremity edema Musculoskeletal: no cyanosis of digits and no clubbing  PSYCH: alert & oriented x 3, fluent speech NEURO: no focal motor/sensory deficits  LABORATORY DATA:  I have reviewed the data as listed CBC Latest Ref Rng & Units 10/04/2021 11/10/2016 12/01/2014  WBC 4.0 - 10.5 K/uL 6.9 6.3 4.7  Hemoglobin 12.0 - 15.0 g/dL 12.9 12.4 12.0  Hematocrit 36.0 - 46.0 %  41.7 39.5 37.4  Platelets 150 - 400 K/uL 1,069(HH) 740(H) 519(H)    CMP Latest Ref Rng & Units 10/04/2021 11/10/2016 12/01/2014  Glucose 70 - 99 mg/dL 97 93 93  BUN 6 - 20 mg/dL _0 Creatinine 0.44 - 1.00 mg/dL 0.70 0.61 1.61(H)  Sodium 135 - 145 mmol/L 140 139 138  Potassium 3.5 - 5.1 mmol/L 5.0 4.6 4.3  Chloride 98 - 111 mmol/L 104 105 102  CO2 22 - 32 mmol/L _1 Calcium 8.9 - 10.3 mg/dL 10.4(H) 9.5 9.4  Total Protein 6.5 - 8.1 g/dL 7.6 7.1 -  Total Bilirubin 0.3 - 1.2 mg/dL 0.7 0.8 -  Alkaline Phos 38 - 126 U/L 70 54 -  AST 15 - 41 U/L 17 16 -  ALT 0 - 44 U/L 15 11 -    RADIOGRAPHIC STUDIES: No results found.  ASSESSMENT & PLAN Tyrese C [place] Harris 55 y.o. female with medical history significant for essential thrombocytosis, JAK2 V617 if positive who presents for establishing care.  After review of the labs, review of the records, and discussion with the patient the patients findings are most consistent with essential thrombocytosis.  The patient has undergone all the appropriate blood work and bone marrow biopsy required for the diagnosis.  There is no evidence of fibrosis and she is confirmed JAK 2 V617F mutation.  Given the patient's age and history she is considered a low risk essential thrombocytosis and  therefore can continue with aspirin therapy alone.  There is no clear indication for cytoreductive therapy at this time.  We will continue to follow the patient on a every 3 month basis in order to monitor her blood counts and assure no conversion into myelofibrosis or acute myeloid leukemia.  The patient voiced understanding of this plan moving forward.  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 --Recommend continuation of aspirin therapy 81 mg p.o. daily alone --No indication for cytoreductive therapy at this time, though it can be considered if plt are consistently >1000 --Plan to have the patient return to clinic with blood checks every 3 months  Orders Placed This Encounter  Procedures   CBC with Differential ([place] Only)    Standing Status:   Standing    Number of Occurrences:   12    Standing Expiration Date:   10/04/2022   CMP ([place] only)    Standing Status:   Standing    Number of Occurrences:   12    Standing Expiration Date:   10/04/2022    All questions were answered. The patient knows to call the clinic with any problems, questions or concerns.  A total of more than 60 minutes were spent on this encounter with face-to-face time and non-face-to-face time, including preparing to see the patient, ordering tests and/or medications, counseling the patient and coordination of care as outlined above.   Ledell Peoples, MD Department of Hematology/Oncology [place] at [facility] Phone: [phone] Pager: [phone] Email: [email or web address]  10/07/2021 11:53 AM

## 2021-10-10 ENCOUNTER — Ambulatory Visit
Admission: RE | Admit: 2021-10-10 | Discharge: 2021-10-10 | Disposition: A | Discharge status: Home / Self Care | Payer: BC Managed Care – PPO | Source: Ambulatory Visit | Attending: Nurse Practitioner | Admitting: Nurse Practitioner

## 2021-10-10 DIAGNOSIS — R748 Abnormal levels of other serum enzymes: Secondary | ICD-10-CM

## 2021-10-10 DIAGNOSIS — R1011 Right upper quadrant pain: Secondary | ICD-10-CM | POA: Diagnosis not present

## 2021-10-10 DIAGNOSIS — R1010 Upper abdominal pain, unspecified: Secondary | ICD-10-CM

## 2021-10-10 DIAGNOSIS — I81 Portal vein thrombosis: Secondary | ICD-10-CM | POA: Diagnosis not present

## 2021-10-24 ENCOUNTER — Telehealth: Payer: Self-pay | Admitting: *Deleted

## 2021-10-24 NOTE — Telephone Encounter (Signed)
Received call from pt inquiring about her lab results from 10/04/21. Advised that I need to have Dr. Lorenso Courier review these and then I will call her back. She voiced understanding.  Please advise

## 2021-11-11 NOTE — Telephone Encounter (Signed)
Pt called stating she has been told she needs to have shoulder surgery and wants to know if she is cleared to have the surgery given her platelets were >100 at her 10/04/21 visit.

## 2021-11-12 ENCOUNTER — Telehealth: Payer: Self-pay | Admitting: *Deleted

## 2021-11-12 ENCOUNTER — Telehealth: Payer: Self-pay | Admitting: Hematology and Oncology

## 2021-11-12 NOTE — Telephone Encounter (Signed)
Called the patient to discuss upcoming surgery with her elevated Plt count.  Platelets of greater than 1000 can unfortunately cause a hypocoagulable state and make patients more prone to bleeding.  The best way to assess for this would be to have the patient return to clinic for lab testing to show she has an acquired von Willebrand syndrome from her excessive platelets.  Additionally at that time we could recheck her platelets to see if they remain above 1000.  Given that she is young and low risk anticoagulation and cytoreductive therapy is not necessarily required at this time though with platelet counts greater than 1000 we should continue close monitoring and consider cytoreductive therapy if she were found to have any coagulation issues or other symptoms.  Left a message for the patient as she did not answer the phone.  Requested a call back.  Ledell Peoples, MD Department of Hematology/Oncology [place] at [facility] Phone: [phone] Pager: [phone] Email: [email or web address]

## 2021-11-12 NOTE — Telephone Encounter (Signed)
No note. Dr Lorenso Courier to call

## 2021-11-19 DIAGNOSIS — D473 Essential (hemorrhagic) thrombocythemia: Secondary | ICD-10-CM | POA: Diagnosis not present

## 2021-11-19 DIAGNOSIS — R768 Other specified abnormal immunological findings in serum: Secondary | ICD-10-CM | POA: Diagnosis not present

## 2021-11-19 DIAGNOSIS — Z1589 Genetic susceptibility to other disease: Secondary | ICD-10-CM | POA: Diagnosis not present

## 2021-11-19 DIAGNOSIS — M75101 Unspecified rotator cuff tear or rupture of right shoulder, not specified as traumatic: Secondary | ICD-10-CM | POA: Diagnosis not present

## 2021-11-20 ENCOUNTER — Other Ambulatory Visit: Payer: Self-pay | Admitting: *Deleted

## 2021-11-20 ENCOUNTER — Telehealth: Payer: Self-pay | Admitting: *Deleted

## 2021-11-20 ENCOUNTER — Inpatient Hospital Stay: Payer: BC Managed Care – PPO | Attending: Hematology and Oncology

## 2021-11-20 ENCOUNTER — Other Ambulatory Visit (HOSPITAL_COMMUNITY): Payer: Self-pay

## 2021-11-20 ENCOUNTER — Other Ambulatory Visit: Payer: Self-pay

## 2021-11-20 ENCOUNTER — Inpatient Hospital Stay: Payer: BC Managed Care – PPO | Admitting: Hematology and Oncology

## 2021-11-20 VITALS — BP 137/61 | HR 68 | Temp 97.0°F | Resp 16 | Wt 137.7 lb

## 2021-11-20 DIAGNOSIS — D473 Essential (hemorrhagic) thrombocythemia: Secondary | ICD-10-CM

## 2021-11-20 DIAGNOSIS — Z7982 Long term (current) use of aspirin: Secondary | ICD-10-CM | POA: Diagnosis not present

## 2021-11-20 LAB — CMP (CANCER CENTER ONLY)
ALT: 19 U/L (ref 0–44)
AST: 20 U/L (ref 15–41)
Albumin: 4.7 g/dL (ref 3.5–5.0)
Alkaline Phosphatase: 77 U/L (ref 38–126)
Anion gap: 5 (ref 5–15)
BUN: 12 mg/dL (ref 6–20)
CO2: 33 mmol/L — ABNORMAL HIGH (ref 22–32)
Calcium: 10.6 mg/dL — ABNORMAL HIGH (ref 8.9–10.3)
Chloride: 101 mmol/L (ref 98–111)
Creatinine: 0.69 mg/dL (ref 0.44–1.00)
GFR, Estimated: >60 mL/min (ref >60)
Glucose, Bld: 126 mg/dL — ABNORMAL HIGH (ref 70–99)
Potassium: 5.1 mmol/L (ref 3.5–5.1)
Sodium: 139 mmol/L (ref 135–145)
Total Bilirubin: 0.8 mg/dL (ref 0.3–1.2)
Total Protein: 7.6 g/dL (ref 6.5–8.1)

## 2021-11-20 LAB — CBC WITH DIFFERENTIAL (CANCER CENTER ONLY)
Abs Immature Granulocytes: 0.01 10*3/uL (ref 0.00–0.07)
Basophils Absolute: 0.1 10*3/uL (ref 0.0–0.1)
Basophils Relative: 2 %
Eosinophils Absolute: 0.1 10*3/uL (ref 0.0–0.5)
Eosinophils Relative: 1 %
HCT: 43.8 % (ref 36.0–46.0)
Hemoglobin: 13.7 g/dL (ref 12.0–15.0)
Immature Granulocytes: 0 %
Lymphocytes Relative: 30 %
Lymphs Abs: 1.9 10*3/uL (ref 0.7–4.0)
MCH: 25.8 pg — ABNORMAL LOW (ref 26.0–34.0)
MCHC: 31.3 g/dL (ref 30.0–36.0)
MCV: 82.3 fL (ref 80.0–100.0)
Monocytes Absolute: 0.5 10*3/uL (ref 0.1–1.0)
Monocytes Relative: 7 %
Neutro Abs: 3.9 10*3/uL (ref 1.7–7.7)
Neutrophils Relative %: 60 %
Platelet Count: 1143 10*3/uL (ref 150–400)
RBC: 5.32 MIL/uL — ABNORMAL HIGH (ref 3.87–5.11)
RDW: 13.7 % (ref 11.5–15.5)
WBC Count: 6.5 10*3/uL (ref 4.0–10.5)
nRBC: 0 % (ref 0.0–0.2)

## 2021-11-20 MED ORDER — HYDROXYUREA 500 MG PO CAPS: 500.0000 mg | ORAL_CAPSULE | Freq: Every day | ORAL | 1 refills | Status: DC

## 2021-11-20 MED ORDER — HYDROXYUREA 500 MG PO CAPS
500.0000 mg | ORAL_CAPSULE | Freq: Every day | ORAL | 1 refills | Status: DC
  Filled 2021-11-20: qty 90, 90d supply, fill #0

## 2021-11-20 NOTE — Telephone Encounter (Signed)
Pt called this am requesting appt with Dr. Lorenso Courier today. She states that her PCP @ [facility] told her that her platelets are > 1 million. Pt does not feel well-headaches, fatigue.  Able to schedule lab and MD visit for this after  lab orders placed for CBC. CMP

## 2021-11-22 NOTE — Progress Notes (Signed)
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 Della Goo at [facility] 10/04/2021: establish care with Dr. Lorenso Courier   11/20/2021: Platelet count 1143, starting hydroxyurea 500 mg p.o. daily.  Interval History:  Amanda Harris 56 y.o. female with medical history significant for essential thrombocytosis who presents for a follow up visit. The patient's last visit was on 10/04/2021 at which time she established care. In the interim since the last visit she has been on aspirin therapy alone but has begun developing new symptoms and presents for an urgent care visit today.  On exam today Amanda Harris reports that she has been having a multitude of symptoms.  She notes that she is having coldness and "frozen" legs and pain in her head.  She also is concerned about some blurry vision, shortness of breath, and pressure on her heart.  She notes that she has never had any symptoms like this before and is having difficulty sleeping.  She notes overall that she "feels terrible".  She feels weak overall and is interested in starting cytoreductive therapy. She currently denies any fevers, chills, sweats, nausea, vomiting or diarrhea.  A full 10 point ROS is listed below.  Today we discussed the risks and benefits of cytoreductive therapy and the patient was agreeable to proceeding with this treatment.   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 MANN   Kidney stones    Mastodynia    Myeloproliferative disease ([place])    JAK2 MUTATION ... FOLLOWED BY DR. NAGALLA AT [facility].   Thrombocytosis      SURGICAL HISTORY: Past Surgical History:  Procedure Laterality Date   COLONOSCOPY W/ BIOPSIES  APRIL 2010   LOW-GRADE NEUROENDOCRINE-CARCINOID TUMOR   TUBAL LIGATION     POST PARTUM    SOCIAL HISTORY: Social History   Socioeconomic History   Marital status: Married    Spouse name: Not on file   Number of children: Not on file   Years of education: Not on file   Highest education level: Not on file  Occupational History   Not on file  Tobacco Use   Smoking status: Never   Smokeless tobacco: Never  Substance and Sexual Activity   Alcohol use: No    Alcohol/week: 0.0 standard drinks   Drug use: No   Sexual activity: Yes    Comment: 1st intercourse- 25, partners- 1  Other Topics Concern   Not on file  Social History Narrative   ** Merged History Encounter **       ** Merged History Encounter **       Social Determinants of Radio broadcast assistant Strain: Not on file  Food Insecurity: Not on file  Transportation Needs: Not on file  Physical Activity: Not on file  Stress: Not on file  Social Connections: Not on file  Intimate Partner Violence: Not on file    FAMILY HISTORY: Family History  Problem Relation Age of Onset   Cancer Mother        LUNG (NON-SMOKER)   Cancer Sister        LUNG (NON-SMOKER)  Breast cancer Neg Hx     ALLERGIES:  has No Known Allergies.  MEDICATIONS:  Current Outpatient Medications  Medication Sig Dispense Refill   aspirin EC 81 MG tablet Take 81 mg by mouth daily.     CALCIUM PO Take 1 tablet by mouth daily. (Patient not taking: Reported on 11/20/2021)     hydroxyurea (HYDREA) 500 MG capsule Take 1 capsule (500 mg total) by mouth daily. May take with food to minimize GI side effects. 90 capsule 1   No current facility-administered medications for this visit.    REVIEW OF SYSTEMS:   Constitutional: ( - ) fevers, ( - )  chills , ( - ) night sweats Eyes: ( - ) blurriness of vision, ( - ) double vision, ( - ) watery  eyes Ears, nose, mouth, throat, and face: ( - ) mucositis, ( - ) sore throat Respiratory: ( - ) cough, ( - ) dyspnea, ( - ) wheezes Cardiovascular: ( - ) palpitation, ( - ) chest discomfort, ( - ) lower extremity swelling Gastrointestinal:  ( - ) nausea, ( - ) heartburn, ( - ) change in bowel habits Skin: ( - ) abnormal skin rashes Lymphatics: ( - ) new lymphadenopathy, ( - ) easy bruising Neurological: ( - ) numbness, ( - ) tingling, ( - ) new weaknesses Behavioral/Psych: ( - ) mood change, ( - ) new changes  All other systems were reviewed with the patient and are negative.  PHYSICAL EXAMINATION: ECOG PERFORMANCE STATUS: 1 - Symptomatic but completely ambulatory  Vitals:   11/20/21 1325  BP: 137/61  Pulse: 68  Resp: 16  Temp: (!) 97 F (36.1 C)  SpO2: 100%   Filed Weights   11/20/21 1325  Weight: 137 lb 11.2 oz (62.5 kg)    GENERAL: Well-appearing middle-aged female, alert, no distress and comfortable SKIN: skin color, texture, turgor are normal, no rashes or significant lesions EYES: conjunctiva are pink and non-injected, sclera clear LUNGS: clear to auscultation and percussion with normal breathing effort HEART: regular rate & rhythm and no murmurs and no lower extremity edema Musculoskeletal: no cyanosis of digits and no clubbing  PSYCH: alert & oriented x 3, fluent speech NEURO: no focal motor/sensory deficits  LABORATORY DATA:  I have reviewed the data as listed CBC Latest Ref Rng & Units 11/20/2021 10/04/2021 11/10/2016  WBC 4.0 - 10.5 K/uL 6.5 6.9 6.3  Hemoglobin 12.0 - 15.0 g/dL 13.7 12.9 12.4  Hematocrit 36.0 - 46.0 % 43.8 41.7 39.5  Platelets 150 - 400 K/uL 1,143(HH) 1,069(HH) 740(H)    CMP Latest Ref Rng & Units 11/20/2021 10/04/2021 11/10/2016  Glucose 70 - 99 mg/dL 126(H) 97 93  BUN 6 - 20 mg/dL 12 11 10   Creatinine 0.44 - 1.00 mg/dL 0.69 0.70 0.61  Sodium 135 - 145 mmol/L 139 140 139  Potassium 3.5 - 5.1 mmol/L 5.1 5.0 4.6  Chloride 98 - 111 mmol/L 101  104 105  CO2 22 - 32 mmol/L 33(H) 30 24  Calcium 8.9 - 10.3 mg/dL 10.6(H) 10.4(H) 9.5  Total Protein 6.5 - 8.1 g/dL 7.6 7.6 7.1  Total Bilirubin 0.3 - 1.2 mg/dL 0.8 0.7 0.8  Alkaline Phos 38 - 126 U/L 77 70 54  AST 15 - 41 U/L 20 17 16   ALT 0 - 44 U/L 19 15 11     No results found for: MPROTEIN No results found for: KPAFRELGTCHN, LAMBDASER, KAPLAMBRATIO  RADIOGRAPHIC STUDIES: No results found.  ASSESSMENT & PLAN Amanda C Tennis Must  Harris 56 y.o. female with medical history significant for essential thrombocytosis who presents for a follow up visit.  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 --Recommend continuation of aspirin therapy 81 mg p.o. daily alone -- After discussion with the patient about the risks and benefits of cytoreductive therapy she was agreeable to proceeding at this time.  I noted that it was unclear if her symptoms were currently related to her thrombocytosis but she wanted to pursue cytoreductive therapy to see if it would be helpful. --Recommend hydroxyurea 500 mg p.o. daily --Plan to have patient return to clinic weekly for the next 2 weeks and return to clinic in 3 weeks to assess how she is tolerating hydroxyurea therapy.    No orders of the defined types were placed in this encounter.   All questions were answered. The patient knows to call the clinic with any problems, questions or concerns.  A total of more than 30 minutes were spent on this encounter with face-to-face time and non-face-to-face time, including preparing to see the patient, ordering tests and/or medications, counseling the patient and coordination of care as outlined above.   Ledell Peoples, MD Department of Hematology/Oncology [place] at [facility] Phone: [phone] Pager: [phone] Email: [email or web address]  11/26/2021 4:30 PM

## 2021-11-29 ENCOUNTER — Other Ambulatory Visit: Payer: Self-pay

## 2021-11-29 ENCOUNTER — Telehealth: Payer: Self-pay | Admitting: *Deleted

## 2021-11-29 ENCOUNTER — Telehealth: Payer: Self-pay

## 2021-11-29 ENCOUNTER — Inpatient Hospital Stay: Payer: BC Managed Care – PPO

## 2021-11-29 DIAGNOSIS — D473 Essential (hemorrhagic) thrombocythemia: Secondary | ICD-10-CM | POA: Diagnosis not present

## 2021-11-29 DIAGNOSIS — Z7982 Long term (current) use of aspirin: Secondary | ICD-10-CM | POA: Diagnosis not present

## 2021-11-29 LAB — CMP (CANCER CENTER ONLY)
ALT: 39 U/L (ref 0–44)
AST: 29 U/L (ref 15–41)
Albumin: 4.5 g/dL (ref 3.5–5.0)
Alkaline Phosphatase: 94 U/L (ref 38–126)
Anion gap: 5 (ref 5–15)
BUN: 10 mg/dL (ref 6–20)
CO2: 33 mmol/L — ABNORMAL HIGH (ref 22–32)
Calcium: 10.2 mg/dL (ref 8.9–10.3)
Chloride: 102 mmol/L (ref 98–111)
Creatinine: 0.63 mg/dL (ref 0.44–1.00)
GFR, Estimated: >60 mL/min (ref >60)
Glucose, Bld: 104 mg/dL — ABNORMAL HIGH (ref 70–99)
Potassium: 5.1 mmol/L (ref 3.5–5.1)
Sodium: 140 mmol/L (ref 135–145)
Total Bilirubin: 0.5 mg/dL (ref 0.3–1.2)
Total Protein: 7.4 g/dL (ref 6.5–8.1)

## 2021-11-29 LAB — CBC WITH DIFFERENTIAL (CANCER CENTER ONLY)
Abs Immature Granulocytes: 0.01 10*3/uL (ref 0.00–0.07)
Basophils Absolute: 0.1 10*3/uL (ref 0.0–0.1)
Basophils Relative: 1 %
Eosinophils Absolute: 0.2 10*3/uL (ref 0.0–0.5)
Eosinophils Relative: 2 %
HCT: 42.8 % (ref 36.0–46.0)
Hemoglobin: 13.6 g/dL (ref 12.0–15.0)
Immature Granulocytes: 0 %
Lymphocytes Relative: 31 %
Lymphs Abs: 2.2 10*3/uL (ref 0.7–4.0)
MCH: 26.2 pg (ref 26.0–34.0)
MCHC: 31.8 g/dL (ref 30.0–36.0)
MCV: 82.5 fL (ref 80.0–100.0)
Monocytes Absolute: 0.5 10*3/uL (ref 0.1–1.0)
Monocytes Relative: 7 %
Neutro Abs: 4.1 10*3/uL (ref 1.7–7.7)
Neutrophils Relative %: 59 %
Platelet Count: 1055 10*3/uL (ref 150–400)
RBC: 5.19 MIL/uL — ABNORMAL HIGH (ref 3.87–5.11)
RDW: 14 % (ref 11.5–15.5)
WBC Count: 6.9 10*3/uL (ref 4.0–10.5)
nRBC: 0 % (ref 0.0–0.2)

## 2021-11-29 NOTE — Telephone Encounter (Signed)
CRITICAL VALUE STICKER  CRITICAL VALUE: PLT = [phone]  RECEIVER (on-site recipient of call): Yetta Glassman, CMA  DATE & TIME NOTIFIED: 11/29/21 at 2:23pm  MESSENGER (representative from lab): Heather  MD NOTIFIED: Lorenso Courier  TIME OF NOTIFICATION: 11/29/21 at   RESPONSE: Notification given to Joesphine Bare., RN for follow-up with pt.

## 2021-11-29 NOTE — Telephone Encounter (Signed)
TCT patient regarding lab results from today. Spoke with her. Informed her that her platelets have started to come down slightly. Reviewed the #s with her. Asked pt about side effects-she states she has some diarrhea every other day or so. Never more than 4 times in a day. Encouraged increased fluid intake. Advised to have some Imodium on hand should the diarrhea get worse at any point. Advised to call if she could not control the diarrhea.  Pt states her headaches are gone at this time. Advised to continue on current dose of hydroxyurea and to keep her weekly lab appts.  Pt voiced understanding.

## 2021-12-06 ENCOUNTER — Other Ambulatory Visit: Payer: Self-pay

## 2021-12-06 ENCOUNTER — Inpatient Hospital Stay: Payer: BC Managed Care – PPO

## 2021-12-06 ENCOUNTER — Telehealth: Payer: Self-pay | Admitting: *Deleted

## 2021-12-06 ENCOUNTER — Other Ambulatory Visit: Payer: Self-pay | Admitting: *Deleted

## 2021-12-06 DIAGNOSIS — D473 Essential (hemorrhagic) thrombocythemia: Secondary | ICD-10-CM | POA: Diagnosis not present

## 2021-12-06 DIAGNOSIS — Z7982 Long term (current) use of aspirin: Secondary | ICD-10-CM | POA: Diagnosis not present

## 2021-12-06 LAB — CBC WITH DIFFERENTIAL (CANCER CENTER ONLY)
Abs Immature Granulocytes: 0.01 10*3/uL (ref 0.00–0.07)
Basophils Absolute: 0.1 10*3/uL (ref 0.0–0.1)
Basophils Relative: 1 %
Eosinophils Absolute: 0.1 10*3/uL (ref 0.0–0.5)
Eosinophils Relative: 1 %
HCT: 41.9 % (ref 36.0–46.0)
Hemoglobin: 13.3 g/dL (ref 12.0–15.0)
Immature Granulocytes: 0 %
Lymphocytes Relative: 28 %
Lymphs Abs: 1.9 10*3/uL (ref 0.7–4.0)
MCH: 26.1 pg (ref 26.0–34.0)
MCHC: 31.7 g/dL (ref 30.0–36.0)
MCV: 82.3 fL (ref 80.0–100.0)
Monocytes Absolute: 0.4 10*3/uL (ref 0.1–1.0)
Monocytes Relative: 5 %
Neutro Abs: 4.5 10*3/uL (ref 1.7–7.7)
Neutrophils Relative %: 65 %
Platelet Count: 990 10*3/uL (ref 150–400)
RBC: 5.09 MIL/uL (ref 3.87–5.11)
RDW: 14.3 % (ref 11.5–15.5)
WBC Count: 7 10*3/uL (ref 4.0–10.5)
nRBC: 0 % (ref 0.0–0.2)

## 2021-12-06 LAB — CMP (CANCER CENTER ONLY)
ALT: 41 U/L (ref 0–44)
AST: 24 U/L (ref 15–41)
Albumin: 4.4 g/dL (ref 3.5–5.0)
Alkaline Phosphatase: 97 U/L (ref 38–126)
Anion gap: 4 — ABNORMAL LOW (ref 5–15)
BUN: 10 mg/dL (ref 6–20)
CO2: 33 mmol/L — ABNORMAL HIGH (ref 22–32)
Calcium: 9.8 mg/dL (ref 8.9–10.3)
Chloride: 102 mmol/L (ref 98–111)
Creatinine: 0.62 mg/dL (ref 0.44–1.00)
GFR, Estimated: >60 mL/min (ref >60)
Glucose, Bld: 136 mg/dL — ABNORMAL HIGH (ref 70–99)
Potassium: 4.6 mmol/L (ref 3.5–5.1)
Sodium: 139 mmol/L (ref 135–145)
Total Bilirubin: 0.7 mg/dL (ref 0.3–1.2)
Total Protein: 7.4 g/dL (ref 6.5–8.1)

## 2021-12-06 MED ORDER — HYDROXYUREA 500 MG PO CAPS: 500.0000 mg | ORAL_CAPSULE | Freq: Two times a day (BID) | ORAL | 1 refills | Status: DC

## 2021-12-06 NOTE — Telephone Encounter (Signed)
TCT patient regarding her lab results from today. Spoke with her and advised that her platelets have come down to 990k. Advised that Dr. Lorenso Courier recommends she increase her Hydroxyurea to 1 capsule (500 mg) 2 x a day Advised that we will recheck her labs and also see her in clinic next Friday, 12/13/21. Pt voiced understanding.

## 2021-12-13 ENCOUNTER — Other Ambulatory Visit: Payer: Self-pay

## 2021-12-13 ENCOUNTER — Inpatient Hospital Stay: Payer: BC Managed Care – PPO | Admitting: Hematology and Oncology

## 2021-12-13 ENCOUNTER — Inpatient Hospital Stay: Payer: BC Managed Care – PPO | Attending: Hematology and Oncology

## 2021-12-13 VITALS — BP 134/77 | HR 62 | Temp 97.7°F | Resp 16 | Wt 143.3 lb

## 2021-12-13 DIAGNOSIS — D473 Essential (hemorrhagic) thrombocythemia: Secondary | ICD-10-CM | POA: Insufficient documentation

## 2021-12-13 LAB — CMP (CANCER CENTER ONLY)
ALT: 22 U/L (ref 0–44)
AST: 19 U/L (ref 15–41)
Albumin: 4.5 g/dL (ref 3.5–5.0)
Alkaline Phosphatase: 89 U/L (ref 38–126)
Anion gap: 6 (ref 5–15)
BUN: 9 mg/dL (ref 6–20)
CO2: 30 mmol/L (ref 22–32)
Calcium: 10.2 mg/dL (ref 8.9–10.3)
Chloride: 102 mmol/L (ref 98–111)
Creatinine: 0.64 mg/dL (ref 0.44–1.00)
GFR, Estimated: >60 mL/min (ref >60)
Glucose, Bld: 94 mg/dL (ref 70–99)
Potassium: 4.9 mmol/L (ref 3.5–5.1)
Sodium: 138 mmol/L (ref 135–145)
Total Bilirubin: 0.7 mg/dL (ref 0.3–1.2)
Total Protein: 7.5 g/dL (ref 6.5–8.1)

## 2021-12-13 LAB — CBC WITH DIFFERENTIAL (CANCER CENTER ONLY)
Abs Immature Granulocytes: 0.02 10*3/uL (ref 0.00–0.07)
Basophils Absolute: 0.1 10*3/uL (ref 0.0–0.1)
Basophils Relative: 1 %
Eosinophils Absolute: 0.1 10*3/uL (ref 0.0–0.5)
Eosinophils Relative: 1 %
HCT: 41.3 % (ref 36.0–46.0)
Hemoglobin: 13 g/dL (ref 12.0–15.0)
Immature Granulocytes: 0 %
Lymphocytes Relative: 27 %
Lymphs Abs: 2 10*3/uL (ref 0.7–4.0)
MCH: 25.8 pg — ABNORMAL LOW (ref 26.0–34.0)
MCHC: 31.5 g/dL (ref 30.0–36.0)
MCV: 82.1 fL (ref 80.0–100.0)
Monocytes Absolute: 0.5 10*3/uL (ref 0.1–1.0)
Monocytes Relative: 7 %
Neutro Abs: 4.6 10*3/uL (ref 1.7–7.7)
Neutrophils Relative %: 64 %
Platelet Count: 985 10*3/uL (ref 150–400)
RBC: 5.03 MIL/uL (ref 3.87–5.11)
RDW: 14.6 % (ref 11.5–15.5)
WBC Count: 7.3 10*3/uL (ref 4.0–10.5)
nRBC: 0 % (ref 0.0–0.2)

## 2021-12-15 NOTE — Progress Notes (Signed)
?[place] ?[phone]   [phone] ? ?PROGRESS NOTE ? ?Patient Care Team: ?Eldridge Scot, MD as PCP - General (Family Medicine) ? 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 Della Goo at [facility] ?10/04/2021: establish care with Dr. Lorenso Courier   ?11/20/2021: Platelet count 1143, starting hydroxyurea 500 mg p.o. daily. ?12/13/2021: Plt 985, recommend increasing dosing hydroxyurea to '500mg'$  PO BID ? ?Interval History:  ?Amanda Harris 56 y.o. female with medical history significant for essential thrombocytosis who presents for a follow up visit. The patient's last visit was on 11/20/2021.  In the interim since her last visit she has been taking hydroxyurea once daily with modest decrease in her platelet count. ? ?On exam today Amanda Harris reports that she did not increase her dosing to twice daily as she has been having some loose stools with the hydroxyurea.  She has she has been taking 1 pill/day and has been having about 2 loose stools daily.  She notes that it occurs typically as soon as she takes the pill.  There is also been some abdominal discomfort consistent with pain prior to diarrhea.  She has not been having any nausea or vomiting.  She denies any sores in her mouth, or ankles.  She reports that she is not having any issues with headache.  Fortunately her other symptoms including headache and generalized unwell feeling have improved on hydroxyurea therapy.  She denies having any other symptoms at this time.  She currently denies any fevers, chills, sweats, nausea, vomiting or diarrhea.  A full 10 point ROS is listed below. ? ? ?MEDICAL HISTORY:  ?Past Medical History:  ?Diagnosis Date  ? ASCUS (atypical squamous cells of undetermined significance) on Pap smear 05/2001  ? C & B BX- ASCUS   ? 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 FOLLOWED BY DR. MANN  ? Kidney stones   ? Mastodynia   ?  Myeloproliferative disease ([place])   ? JAK2 MUTATION ... FOLLOWED BY DR. NAGALLA AT [facility].  ? Thrombocytosis   ? ? ?SURGICAL HISTORY: ?Past Surgical History:  ?Procedure Laterality Date  ? COLONOSCOPY W/ BIOPSIES  APRIL 2010  ? LOW-GRADE NEUROENDOCRINE-CARCINOID TUMOR  ? TUBAL LIGATION    ? POST PARTUM  ? ? ?SOCIAL HISTORY: ?Social History  ? ?Socioeconomic History  ? Marital status: Married  ?  Spouse name: Not on file  ? Number of children: Not on file  ? Years of education: Not on file  ? Highest education level: Not on file  ?Occupational History  ? Not on file  ?Tobacco Use  ? Smoking status: Never  ? Smokeless tobacco: Never  ?Substance and Sexual Activity  ? Alcohol use: No  ?  Alcohol/week: 0.0 standard drinks  ? Drug use: No  ? Sexual activity: Yes  ?  Comment: 1st intercourse- 58, partners- 1  ?Other Topics Concern  ? Not on file  ?Social History Narrative  ? ** Merged History Encounter **  ?    ? ** Merged History Encounter **  ?    ? ?Social Determinants of Health  ? ?Financial Resource Strain: Not on file  ?Food Insecurity: Not on file  ?Transportation Needs: Not on file  ?Physical Activity: Not on file  ?Stress: Not on file  ?Social Connections: Not on file  ?Intimate Partner Violence: Not on file  ? ? ?FAMILY HISTORY: ?Family History  ?Problem Relation Age of Onset  ? Cancer  Mother   ?     LUNG (NON-SMOKER)  ? Cancer Sister   ?     LUNG (NON-SMOKER)  ? Breast cancer Neg Hx   ? ? ?ALLERGIES:  has No Known Allergies. ? ?MEDICATIONS:  ?Current Outpatient Medications  ?Medication Sig Dispense Refill  ? aspirin EC 81 MG tablet Take 81 mg by mouth daily.    ? CALCIUM PO Take 1 tablet by mouth daily. (Patient not taking: Reported on 11/20/2021)    ? hydroxyurea (HYDREA) 500 MG capsule Take 1 capsule (500 mg total) by mouth 2 (two) times daily. May take with food to minimize GI side effects. 60 capsule 1  ? ?No current facility-administered medications for this visit.  ? ? ?REVIEW OF SYSTEMS:    ?Constitutional: ( - ) fevers, ( - )  chills , ( - ) night sweats ?Eyes: ( - ) blurriness of vision, ( - ) double vision, ( - ) watery eyes ?Ears, nose, mouth, throat, and face: ( - ) mucositis, ( - ) sore throat ?Respiratory: ( - ) cough, ( - ) dyspnea, ( - ) wheezes ?Cardiovascular: ( - ) palpitation, ( - ) chest discomfort, ( - ) lower extremity swelling ?Gastrointestinal:  ( - ) nausea, ( - ) heartburn, ( - ) change in bowel habits ?Skin: ( - ) abnormal skin rashes ?Lymphatics: ( - ) new lymphadenopathy, ( - ) easy bruising ?Neurological: ( - ) numbness, ( - ) tingling, ( - ) new weaknesses ?Behavioral/Psych: ( - ) mood change, ( - ) new changes  ?All other systems were reviewed with the patient and are negative. ? ?PHYSICAL EXAMINATION: ?ECOG PERFORMANCE STATUS: 1 - Symptomatic but completely ambulatory ? ?Vitals:  ? 12/13/21 1428  ?BP: 134/77  ?Pulse: 62  ?Resp: 16  ?Temp: 97.7 ?F (36.5 ?C)  ?SpO2: 100%  ? ?Filed Weights  ? 12/13/21 1428  ?Weight: 143 lb 4.8 oz (65 kg)  ? ? ?GENERAL: Well-appearing middle-aged female, alert, no distress and comfortable ?SKIN: skin color, texture, turgor are normal, no rashes or significant lesions ?EYES: conjunctiva are pink and non-injected, sclera clear ?LUNGS: clear to auscultation and percussion with normal breathing effort ?HEART: regular rate & rhythm and no murmurs and no lower extremity edema ?Musculoskeletal: no cyanosis of digits and no clubbing  ?PSYCH: alert & oriented x 3, fluent speech ?NEURO: no focal motor/sensory deficits ? ?LABORATORY DATA:  ?I have reviewed the data as listed ?CBC Latest Ref Rng & Units 12/13/2021 12/06/2021 11/29/2021  ?WBC 4.0 - 10.5 K/uL 7.3 7.0 6.9  ?Hemoglobin 12.0 - 15.0 g/dL 13.0 13.3 13.6  ?Hematocrit 36.0 - 46.0 % 41.3 41.9 42.8  ?Platelets 150 - 400 K/uL 985(HH) 990(HH) 1,055(HH)  ? ? ?CMP Latest Ref Rng & Units 12/13/2021 12/06/2021 11/29/2021  ?Glucose 70 - 99 mg/dL 94 136(H) 104(H)  ?BUN 6 - 20 mg/dL '9 10 10  '$ ?Creatinine 0.44 - 1.00  mg/dL 0.64 0.62 0.63  ?Sodium 135 - 145 mmol/L 138 139 140  ?Potassium 3.5 - 5.1 mmol/L 4.9 4.6 5.1  ?Chloride 98 - 111 mmol/L 102 102 102  ?CO2 22 - 32 mmol/L 30 33(H) 33(H)  ?Calcium 8.9 - 10.3 mg/dL 10.2 9.8 10.2  ?Total Protein 6.5 - 8.1 g/dL 7.5 7.4 7.4  ?Total Bilirubin 0.3 - 1.2 mg/dL 0.7 0.7 0.5  ?Alkaline Phos 38 - 126 U/L 89 97 94  ?AST 15 - 41 U/L '19 24 29  '$ ?ALT 0 - 44 U/L 22 41 39  ? ? ?  No results found for: MPROTEIN ?No results found for: KPAFRELGTCHN, LAMBDASER, KAPLAMBRATIO ? ?RADIOGRAPHIC STUDIES: ?No results found. ? ?ASSESSMENT & PLAN ?Amanda Harris 56 y.o. female with medical history significant for essential thrombocytosis who presents for a follow up visit. ? 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 ?-- After discussion with the patient about the risks and benefits of cytoreductive therapy she was agreeable to proceeding at this time.  I noted that it was unclear if her symptoms were currently related to her thrombocytosis but she wanted to pursue cytoreductive therapy to see if it would be helpful. ?Plan:  ?--Recommend hydroxyurea 500 mg BID p.o. daily ?--Recommend continuation of aspirin therapy 81 mg p.o. daily alone ?--Plan to have patient return to clinic for labs in 2 weeks and return to clinic in 4 weeks to assess how she is tolerating hydroxyurea therapy. ?  ?No orders of the defined types were placed in this encounter. ? ?All questions were answered. The patient knows to call the clinic with any problems, questions or concerns. ? ?A total of more than 30 minutes were spent on this encounter with face-to-face time and non-face-to-face time, including preparing to see the patient, ordering tests and/or medications, counseling the patient and coordination of care as outlined above.  ? ?Ledell Peoples, MD ?Department of Hematology/Oncology ?[place] [facility] ?Phone: [phone] ?Pager: [phone] ?Email: [email or web address] ? ?12/15/2021 12:09 PM ? ?

## 2021-12-16 ENCOUNTER — Telehealth: Payer: Self-pay | Admitting: Hematology and Oncology

## 2021-12-16 NOTE — Telephone Encounter (Signed)
Scheduled follow-up appointment per 3/3 los. Patient is aware. 

## 2021-12-27 ENCOUNTER — Other Ambulatory Visit: Payer: Self-pay

## 2021-12-27 ENCOUNTER — Inpatient Hospital Stay: Payer: BC Managed Care – PPO

## 2021-12-27 DIAGNOSIS — D473 Essential (hemorrhagic) thrombocythemia: Secondary | ICD-10-CM | POA: Diagnosis not present

## 2021-12-27 LAB — CMP (CANCER CENTER ONLY)
ALT: 18 U/L (ref 0–44)
AST: 18 U/L (ref 15–41)
Albumin: 4.6 g/dL (ref 3.5–5.0)
Alkaline Phosphatase: 83 U/L (ref 38–126)
Anion gap: 5 (ref 5–15)
BUN: 11 mg/dL (ref 6–20)
CO2: 33 mmol/L — ABNORMAL HIGH (ref 22–32)
Calcium: 10.1 mg/dL (ref 8.9–10.3)
Chloride: 104 mmol/L (ref 98–111)
Creatinine: 0.69 mg/dL (ref 0.44–1.00)
GFR, Estimated: >60 mL/min (ref >60)
Glucose, Bld: 90 mg/dL (ref 70–99)
Potassium: 4.9 mmol/L (ref 3.5–5.1)
Sodium: 142 mmol/L (ref 135–145)
Total Bilirubin: 0.5 mg/dL (ref 0.3–1.2)
Total Protein: 7.6 g/dL (ref 6.5–8.1)

## 2021-12-27 LAB — CBC WITH DIFFERENTIAL (CANCER CENTER ONLY)
Abs Immature Granulocytes: 0.01 10*3/uL (ref 0.00–0.07)
Basophils Absolute: 0.1 10*3/uL (ref 0.0–0.1)
Basophils Relative: 2 %
Eosinophils Absolute: 0.1 10*3/uL (ref 0.0–0.5)
Eosinophils Relative: 2 %
HCT: 41.3 % (ref 36.0–46.0)
Hemoglobin: 13.3 g/dL (ref 12.0–15.0)
Immature Granulocytes: 0 %
Lymphocytes Relative: 35 %
Lymphs Abs: 2.3 10*3/uL (ref 0.7–4.0)
MCH: 26.5 pg (ref 26.0–34.0)
MCHC: 32.2 g/dL (ref 30.0–36.0)
MCV: 82.4 fL (ref 80.0–100.0)
Monocytes Absolute: 0.5 10*3/uL (ref 0.1–1.0)
Monocytes Relative: 8 %
Neutro Abs: 3.5 10*3/uL (ref 1.7–7.7)
Neutrophils Relative %: 53 %
Platelet Count: 855 10*3/uL — ABNORMAL HIGH (ref 150–400)
RBC: 5.01 MIL/uL (ref 3.87–5.11)
RDW: 14.9 % (ref 11.5–15.5)
WBC Count: 6.5 10*3/uL (ref 4.0–10.5)
nRBC: 0 % (ref 0.0–0.2)

## 2021-12-31 ENCOUNTER — Telehealth: Payer: Self-pay

## 2021-12-31 NOTE — Telephone Encounter (Signed)
?  Called pt to adviset that plt count is improving. If she is tolerating medication, recommend to continue on same dose. Patient voiced understanding. Reviewed appointments for 01/08/22. ?

## 2022-01-08 ENCOUNTER — Inpatient Hospital Stay: Payer: BC Managed Care – PPO

## 2022-01-08 ENCOUNTER — Inpatient Hospital Stay: Payer: BC Managed Care – PPO | Admitting: Hematology and Oncology

## 2022-01-24 ENCOUNTER — Inpatient Hospital Stay: Payer: BC Managed Care – PPO | Admitting: Hematology and Oncology

## 2022-01-24 ENCOUNTER — Inpatient Hospital Stay: Payer: BC Managed Care – PPO | Attending: Hematology and Oncology

## 2022-01-24 ENCOUNTER — Inpatient Hospital Stay: Payer: BC Managed Care – PPO

## 2022-01-24 ENCOUNTER — Other Ambulatory Visit: Payer: Self-pay

## 2022-01-24 ENCOUNTER — Other Ambulatory Visit (HOSPITAL_COMMUNITY): Payer: Self-pay

## 2022-01-24 VITALS — BP 109/73 | HR 64 | Temp 97.7°F | Resp 18 | Ht 61.0 in | Wt 143.3 lb

## 2022-01-24 DIAGNOSIS — Z7982 Long term (current) use of aspirin: Secondary | ICD-10-CM | POA: Diagnosis not present

## 2022-01-24 DIAGNOSIS — D473 Essential (hemorrhagic) thrombocythemia: Secondary | ICD-10-CM | POA: Insufficient documentation

## 2022-01-24 DIAGNOSIS — Z79899 Other long term (current) drug therapy: Secondary | ICD-10-CM | POA: Insufficient documentation

## 2022-01-24 LAB — CMP (CANCER CENTER ONLY)
ALT: 19 U/L (ref 0–44)
AST: 19 U/L (ref 15–41)
Albumin: 4.3 g/dL (ref 3.5–5.0)
Alkaline Phosphatase: 67 U/L (ref 38–126)
Anion gap: 5 (ref 5–15)
BUN: 10 mg/dL (ref 6–20)
CO2: 31 mmol/L (ref 22–32)
Calcium: 9.8 mg/dL (ref 8.9–10.3)
Chloride: 101 mmol/L (ref 98–111)
Creatinine: 0.65 mg/dL (ref 0.44–1.00)
GFR, Estimated: >60 mL/min (ref >60)
Glucose, Bld: 89 mg/dL (ref 70–99)
Potassium: 4.9 mmol/L (ref 3.5–5.1)
Sodium: 137 mmol/L (ref 135–145)
Total Bilirubin: 0.6 mg/dL (ref 0.3–1.2)
Total Protein: 7.3 g/dL (ref 6.5–8.1)

## 2022-01-24 LAB — CBC WITH DIFFERENTIAL (CANCER CENTER ONLY)
Abs Immature Granulocytes: 0.01 10*3/uL (ref 0.00–0.07)
Basophils Absolute: 0.1 10*3/uL (ref 0.0–0.1)
Basophils Relative: 2 %
Eosinophils Absolute: 0.1 10*3/uL (ref 0.0–0.5)
Eosinophils Relative: 1 %
HCT: 40.5 % (ref 36.0–46.0)
Hemoglobin: 12.6 g/dL (ref 12.0–15.0)
Immature Granulocytes: 0 %
Lymphocytes Relative: 34 %
Lymphs Abs: 1.9 10*3/uL (ref 0.7–4.0)
MCH: 26.3 pg (ref 26.0–34.0)
MCHC: 31.1 g/dL (ref 30.0–36.0)
MCV: 84.4 fL (ref 80.0–100.0)
Monocytes Absolute: 0.5 10*3/uL (ref 0.1–1.0)
Monocytes Relative: 9 %
Neutro Abs: 3 10*3/uL (ref 1.7–7.7)
Neutrophils Relative %: 54 %
Platelet Count: 766 10*3/uL — ABNORMAL HIGH (ref 150–400)
RBC: 4.8 MIL/uL (ref 3.87–5.11)
RDW: 15.7 % — ABNORMAL HIGH (ref 11.5–15.5)
WBC Count: 5.5 10*3/uL (ref 4.0–10.5)
nRBC: 0 % (ref 0.0–0.2)

## 2022-01-24 MED ORDER — HYDROXYUREA 500 MG PO CAPS
500.0000 mg | ORAL_CAPSULE | ORAL | 1 refills | Status: DC
  Filled 2022-01-24: qty 90, 30d supply, fill #0

## 2022-01-24 NOTE — Progress Notes (Signed)
?[place] ?[phone]   [phone] ? ?PROGRESS NOTE ? ?Patient Care Team: ?Eldridge Scot, MD as PCP - General (Family Medicine) ? 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 Della Goo at [facility] ?10/04/2021: establish care with Dr. Lorenso Courier   ?11/20/2021: Platelet count 1143, starting hydroxyurea 500 mg p.o. daily. ?12/13/2021: Plt 985, recommend increasing dosing hydroxyurea to '500mg'$  PO BID ?01/24/2022: Plt 766, recommend increasing dosing hydroxyurea to 500 mg in AM and 1000 mg in PM.  ? ?Interval History:  ?Amanda Harris 56 y.o. female with medical history significant for essential thrombocytosis who presents for a follow up visit. The patient's last visit was on 12/13/2021.  In the interim since her last visit she has been taking hydroxyurea once daily with modest decrease in her platelet count. ? ?On exam today Amanda Harris reports she has been well overall in the interim since her last visit.  She notes that she is almost out of her hydroxyurea therapy.  She notes that sometimes it upsets her stomach she has loose stools about twice per week.  She notes that she has been faithfully taking her baby aspirin.  She notes that occasionally she does have some bleeding in her mouth most recently noted about 2 weeks ago.  She notes that there is a small ulcer under her tongue which may be the cause of this.  Her energy levels are good and she has been eating well.  She is agreeable to increasing the dose in order to better control her platelet count.  She currently denies any fevers, chills, sweats, nausea, vomiting or diarrhea.  A full 10 point ROS is listed below. ? ? ?MEDICAL HISTORY:  ?Past Medical History:  ?Diagnosis Date  ? ASCUS (atypical squamous cells of undetermined significance) on Pap smear 05/2001  ? C & B BX- ASCUS   ? 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 FOLLOWED BY DR. MANN  ? Kidney  stones   ? Mastodynia   ? Myeloproliferative disease ([place])   ? JAK2 MUTATION ... FOLLOWED BY DR. NAGALLA AT [facility].  ? Thrombocytosis   ? ? ?SURGICAL HISTORY: ?Past Surgical History:  ?Procedure Laterality Date  ? COLONOSCOPY W/ BIOPSIES  APRIL 2010  ? LOW-GRADE NEUROENDOCRINE-CARCINOID TUMOR  ? TUBAL LIGATION    ? POST PARTUM  ? ? ?SOCIAL HISTORY: ?Social History  ? ?Socioeconomic History  ? Marital status: Married  ?  Spouse name: Not on file  ? Number of children: Not on file  ? Years of education: Not on file  ? Highest education level: Not on file  ?Occupational History  ? Not on file  ?Tobacco Use  ? Smoking status: Never  ? Smokeless tobacco: Never  ?Substance and Sexual Activity  ? Alcohol use: No  ?  Alcohol/week: 0.0 standard drinks  ? Drug use: No  ? Sexual activity: Yes  ?  Comment: 1st intercourse- 57, partners- 1  ?Other Topics Concern  ? Not on file  ?Social History Narrative  ? ** Merged History Encounter **  ?    ? ** Merged History Encounter **  ?    ? ?Social Determinants of Health  ? ?Financial Resource Strain: Not on file  ?Food Insecurity: Not on file  ?Transportation Needs: Not on file  ?Physical Activity: Not on file  ?Stress: Not on file  ?Social Connections: Not on file  ?Intimate Partner Violence: Not on file  ? ? ?  FAMILY HISTORY: ?Family History  ?Problem Relation Age of Onset  ? Cancer Mother   ?     LUNG (NON-SMOKER)  ? Cancer Sister   ?     LUNG (NON-SMOKER)  ? Breast cancer Neg Hx   ? ? ?ALLERGIES:  has No Known Allergies. ? ?MEDICATIONS:  ?Current Outpatient Medications  ?Medication Sig Dispense Refill  ? hydroxyurea (HYDREA) 500 MG capsule Take 1 capsule (500 mg total) by mouth as directed. May take with food to minimize GI side effects. Take 1 capsule 500 MG in the morning and 2 capsules (1000 MG) in the evening. 90 capsule 1  ? aspirin EC 81 MG tablet Take 81 mg by mouth daily.    ? CALCIUM PO Take 1 tablet by mouth daily. (Patient not taking: Reported on 11/20/2021)    ? ?No  current facility-administered medications for this visit.  ? ? ?REVIEW OF SYSTEMS:   ?Constitutional: ( - ) fevers, ( - )  chills , ( - ) night sweats ?Eyes: ( - ) blurriness of vision, ( - ) double vision, ( - ) watery eyes ?Ears, nose, mouth, throat, and face: ( - ) mucositis, ( - ) sore throat ?Respiratory: ( - ) cough, ( - ) dyspnea, ( - ) wheezes ?Cardiovascular: ( - ) palpitation, ( - ) chest discomfort, ( - ) lower extremity swelling ?Gastrointestinal:  ( - ) nausea, ( - ) heartburn, ( - ) change in bowel habits ?Skin: ( - ) abnormal skin rashes ?Lymphatics: ( - ) new lymphadenopathy, ( - ) easy bruising ?Neurological: ( - ) numbness, ( - ) tingling, ( - ) new weaknesses ?Behavioral/Psych: ( - ) mood change, ( - ) new changes  ?All other systems were reviewed with the patient and are negative. ? ?PHYSICAL EXAMINATION: ?ECOG PERFORMANCE STATUS: 1 - Symptomatic but completely ambulatory ? ?Vitals:  ? 01/24/22 1038  ?BP: 109/73  ?Pulse: 64  ?Resp: 18  ?Temp: 97.7 ?F (36.5 ?C)  ?SpO2: 100%  ? ?Filed Weights  ? 01/24/22 1038  ?Weight: 143 lb 4.8 oz (65 kg)  ? ? ?GENERAL: Well-appearing middle-aged female, alert, no distress and comfortable ?SKIN: skin color, texture, turgor are normal, no rashes or significant lesions ?EYES: conjunctiva are pink and non-injected, sclera clear ?LUNGS: clear to auscultation and percussion with normal breathing effort ?HEART: regular rate & rhythm and no murmurs and no lower extremity edema ?Musculoskeletal: no cyanosis of digits and no clubbing  ?PSYCH: alert & oriented x 3, fluent speech ?NEURO: no focal motor/sensory deficits ? ?LABORATORY DATA:  ?I have reviewed the data as listed ? ?  Latest Ref Rng & Units 01/24/2022  ? 10:22 AM 12/27/2021  ?  2:07 PM 12/13/2021  ?  1:40 PM  ?CBC  ?WBC 4.0 - 10.5 K/uL 5.5   6.5   7.3    ?Hemoglobin 12.0 - 15.0 g/dL 12.6   13.3   13.0    ?Hematocrit 36.0 - 46.0 % 40.5   41.3   41.3    ?Platelets 150 - 400 K/uL 766   855   985    ? ? ? ?  Latest Ref  Rng & Units 01/24/2022  ? 10:22 AM 12/27/2021  ?  2:07 PM 12/13/2021  ?  1:40 PM  ?CMP  ?Glucose 70 - 99 mg/dL 89   90   94    ?BUN 6 - 20 mg/dL '10   11   9    '$ ?Creatinine 0.44 -  1.00 mg/dL 0.65   0.69   0.64    ?Sodium 135 - 145 mmol/L 137   142   138    ?Potassium 3.5 - 5.1 mmol/L 4.9   4.9   4.9    ?Chloride 98 - 111 mmol/L 101   104   102    ?CO2 22 - 32 mmol/L 31   33   30    ?Calcium 8.9 - 10.3 mg/dL 9.8   10.1   10.2    ?Total Protein 6.5 - 8.1 g/dL 7.3   7.6   7.5    ?Total Bilirubin 0.3 - 1.2 mg/dL 0.6   0.5   0.7    ?Alkaline Phos 38 - 126 U/L 67   83   89    ?AST 15 - 41 U/L '19   18   19    '$ ?ALT 0 - 44 U/L '19   18   22    '$ ? ? ?No results found for: MPROTEIN ?No results found for: KPAFRELGTCHN, LAMBDASER, KAPLAMBRATIO ? ?RADIOGRAPHIC STUDIES: ?No results found. ? ?ASSESSMENT & PLAN ?Amanda Harris 56 y.o. female with medical history significant for essential thrombocytosis who presents for a follow up visit. ? 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 ?-- After discussion with the patient about the risks and benefits of cytoreductive therapy she was agreeable to proceeding at this time.  I noted that it was unclear if her symptoms were currently related to her thrombocytosis but she wanted to pursue cytoreductive therapy to see if it would be helpful. ?Plan:  ?--Recommend hydroxyurea 500 mg in the AM and 1000 mg in the PM p.o. daily (increasing from '500mg'$  BID).  ?--Recommend continuation of aspirin therapy 81 mg p.o. daily alone ?--labs today show a WBC 5.5 Hgb 12.6, MCV 84.4, Plt 766 ?--Plan to have patient return to clinic for labs in 2 weeks and return to clinic in 4 weeks to assess how she is tolerating hydroxyurea therapy. ?  ?No orders of the defined types were placed in this encounter. ? ?All questions were answered. The patient knows to call the clinic with any problems, questions or  concerns. ? ?A total of more than 30 minutes were spent on this encounter with face-to-face time and non-face-to-face time, including preparing to see the patient, ordering tests and/or medications, counseling

## 2022-02-07 ENCOUNTER — Inpatient Hospital Stay: Payer: BC Managed Care – PPO

## 2022-02-07 ENCOUNTER — Other Ambulatory Visit: Payer: Self-pay

## 2022-02-07 ENCOUNTER — Other Ambulatory Visit: Payer: Self-pay | Admitting: Hematology and Oncology

## 2022-02-07 DIAGNOSIS — D473 Essential (hemorrhagic) thrombocythemia: Secondary | ICD-10-CM

## 2022-02-07 DIAGNOSIS — Z79899 Other long term (current) drug therapy: Secondary | ICD-10-CM | POA: Diagnosis not present

## 2022-02-07 DIAGNOSIS — Z7982 Long term (current) use of aspirin: Secondary | ICD-10-CM | POA: Diagnosis not present

## 2022-02-07 LAB — CBC WITH DIFFERENTIAL (CANCER CENTER ONLY)
Abs Immature Granulocytes: 0.02 10*3/uL (ref 0.00–0.07)
Basophils Absolute: 0.1 10*3/uL (ref 0.0–0.1)
Basophils Relative: 1 %
Eosinophils Absolute: 0.1 10*3/uL (ref 0.0–0.5)
Eosinophils Relative: 1 %
HCT: 41 % (ref 36.0–46.0)
Hemoglobin: 13.3 g/dL (ref 12.0–15.0)
Immature Granulocytes: 0 %
Lymphocytes Relative: 31 %
Lymphs Abs: 2.2 10*3/uL (ref 0.7–4.0)
MCH: 27 pg (ref 26.0–34.0)
MCHC: 32.4 g/dL (ref 30.0–36.0)
MCV: 83.3 fL (ref 80.0–100.0)
Monocytes Absolute: 0.4 10*3/uL (ref 0.1–1.0)
Monocytes Relative: 6 %
Neutro Abs: 4.3 10*3/uL (ref 1.7–7.7)
Neutrophils Relative %: 61 %
Platelet Count: 773 10*3/uL — ABNORMAL HIGH (ref 150–400)
RBC: 4.92 MIL/uL (ref 3.87–5.11)
RDW: 15.8 % — ABNORMAL HIGH (ref 11.5–15.5)
WBC Count: 7 10*3/uL (ref 4.0–10.5)
nRBC: 0 % (ref 0.0–0.2)

## 2022-02-19 ENCOUNTER — Other Ambulatory Visit: Payer: Self-pay | Admitting: *Deleted

## 2022-02-19 ENCOUNTER — Other Ambulatory Visit (HOSPITAL_COMMUNITY): Payer: Self-pay

## 2022-02-19 MED ORDER — HYDROXYUREA 500 MG PO CAPS
500.0000 mg | ORAL_CAPSULE | ORAL | 1 refills | Status: DC
  Filled 2022-02-19 – 2022-03-05 (×2): qty 90, 30d supply, fill #0
  Filled 2022-03-28: qty 70, 23d supply, fill #1
  Filled 2022-03-28: qty 20, 7d supply, fill #1

## 2022-02-21 ENCOUNTER — Telehealth: Payer: Self-pay

## 2022-02-21 ENCOUNTER — Inpatient Hospital Stay: Payer: BC Managed Care – PPO | Attending: Hematology and Oncology

## 2022-02-21 ENCOUNTER — Inpatient Hospital Stay: Payer: BC Managed Care – PPO | Admitting: Hematology and Oncology

## 2022-02-21 NOTE — Telephone Encounter (Signed)
Patient was a no show for Lab and MD apt today. Left VM requesting call back to re-schedule missed appointments. ?

## 2022-02-28 ENCOUNTER — Other Ambulatory Visit (HOSPITAL_COMMUNITY): Payer: Self-pay

## 2022-03-05 ENCOUNTER — Other Ambulatory Visit (HOSPITAL_COMMUNITY): Payer: Self-pay

## 2022-03-14 ENCOUNTER — Telehealth: Payer: Self-pay | Admitting: Hematology and Oncology

## 2022-03-14 NOTE — Telephone Encounter (Signed)
.  Called patient to schedule appointment per 6/2 inbasket, patient is aware of date and time.

## 2022-03-28 ENCOUNTER — Telehealth: Payer: Self-pay | Admitting: Hematology and Oncology

## 2022-03-28 ENCOUNTER — Inpatient Hospital Stay (HOSPITAL_BASED_OUTPATIENT_CLINIC_OR_DEPARTMENT_OTHER): Payer: BC Managed Care – PPO | Admitting: Hematology and Oncology

## 2022-03-28 ENCOUNTER — Other Ambulatory Visit: Payer: Self-pay

## 2022-03-28 ENCOUNTER — Inpatient Hospital Stay: Payer: BC Managed Care – PPO | Attending: Hematology and Oncology

## 2022-03-28 ENCOUNTER — Other Ambulatory Visit (HOSPITAL_COMMUNITY): Payer: Self-pay

## 2022-03-28 ENCOUNTER — Other Ambulatory Visit: Payer: Self-pay | Admitting: Lab

## 2022-03-28 VITALS — BP 116/61 | HR 64 | Temp 97.0°F | Resp 16 | Wt 142.4 lb

## 2022-03-28 DIAGNOSIS — D473 Essential (hemorrhagic) thrombocythemia: Secondary | ICD-10-CM

## 2022-03-28 LAB — CMP (CANCER CENTER ONLY)
ALT: 25 U/L (ref 0–44)
AST: 22 U/L (ref 15–41)
Albumin: 4.4 g/dL (ref 3.5–5.0)
Alkaline Phosphatase: 102 U/L (ref 38–126)
Anion gap: 4 — ABNORMAL LOW (ref 5–15)
BUN: 10 mg/dL (ref 6–20)
CO2: 30 mmol/L (ref 22–32)
Calcium: 9.8 mg/dL (ref 8.9–10.3)
Chloride: 103 mmol/L (ref 98–111)
Creatinine: 0.62 mg/dL (ref 0.44–1.00)
GFR, Estimated: >60 mL/min (ref >60)
Glucose, Bld: 95 mg/dL (ref 70–99)
Potassium: 4.5 mmol/L (ref 3.5–5.1)
Sodium: 137 mmol/L (ref 135–145)
Total Bilirubin: 0.6 mg/dL (ref 0.3–1.2)
Total Protein: 7.3 g/dL (ref 6.5–8.1)

## 2022-03-28 LAB — CBC WITH DIFFERENTIAL (CANCER CENTER ONLY)
Abs Immature Granulocytes: 0.01 10*3/uL (ref 0.00–0.07)
Basophils Absolute: 0 10*3/uL (ref 0.0–0.1)
Basophils Relative: 1 %
Eosinophils Absolute: 0 10*3/uL (ref 0.0–0.5)
Eosinophils Relative: 1 %
HCT: 33.9 % — ABNORMAL LOW (ref 36.0–46.0)
Hemoglobin: 11 g/dL — ABNORMAL LOW (ref 12.0–15.0)
Immature Granulocytes: 0 %
Lymphocytes Relative: 34 %
Lymphs Abs: 1.4 10*3/uL (ref 0.7–4.0)
MCH: 28.4 pg (ref 26.0–34.0)
MCHC: 32.4 g/dL (ref 30.0–36.0)
MCV: 87.6 fL (ref 80.0–100.0)
Monocytes Absolute: 0.4 10*3/uL (ref 0.1–1.0)
Monocytes Relative: 10 %
Neutro Abs: 2.3 10*3/uL (ref 1.7–7.7)
Neutrophils Relative %: 54 %
Platelet Count: 506 10*3/uL — ABNORMAL HIGH (ref 150–400)
RBC: 3.87 MIL/uL (ref 3.87–5.11)
RDW: 16.1 % — ABNORMAL HIGH (ref 11.5–15.5)
WBC Count: 4.2 10*3/uL (ref 4.0–10.5)
nRBC: 0 % (ref 0.0–0.2)

## 2022-03-28 MED ORDER — HYDROXYUREA 500 MG PO CAPS
500.0000 mg | ORAL_CAPSULE | ORAL | 1 refills | Status: DC
  Filled 2022-04-07: qty 90, 30d supply, fill #0
  Filled 2022-04-24: qty 90, 30d supply, fill #1

## 2022-03-28 NOTE — Telephone Encounter (Signed)
Per 6/16 los called and left message for pt about appointment.  Call back number and details were left

## 2022-03-31 ENCOUNTER — Other Ambulatory Visit (HOSPITAL_COMMUNITY): Payer: Self-pay

## 2022-04-07 ENCOUNTER — Other Ambulatory Visit (HOSPITAL_COMMUNITY): Payer: Self-pay

## 2022-04-24 ENCOUNTER — Other Ambulatory Visit (HOSPITAL_COMMUNITY): Payer: Self-pay

## 2022-06-06 ENCOUNTER — Other Ambulatory Visit: Payer: Self-pay

## 2022-06-06 ENCOUNTER — Other Ambulatory Visit (HOSPITAL_COMMUNITY): Payer: Self-pay

## 2022-06-06 ENCOUNTER — Inpatient Hospital Stay: Payer: Self-pay | Attending: Hematology and Oncology | Admitting: Hematology and Oncology

## 2022-06-06 ENCOUNTER — Inpatient Hospital Stay: Payer: Self-pay

## 2022-06-06 VITALS — BP 132/78 | HR 97 | Temp 97.3°F | Resp 17 | Ht 61.0 in | Wt 145.2 lb

## 2022-06-06 DIAGNOSIS — D473 Essential (hemorrhagic) thrombocythemia: Secondary | ICD-10-CM

## 2022-06-06 DIAGNOSIS — Z7982 Long term (current) use of aspirin: Secondary | ICD-10-CM | POA: Insufficient documentation

## 2022-06-06 LAB — CBC WITH DIFFERENTIAL (CANCER CENTER ONLY)
Abs Immature Granulocytes: 0.01 10*3/uL (ref 0.00–0.07)
Basophils Absolute: 0.1 10*3/uL (ref 0.0–0.1)
Basophils Relative: 1 %
Eosinophils Absolute: 0.1 10*3/uL (ref 0.0–0.5)
Eosinophils Relative: 1 %
HCT: 35.3 % — ABNORMAL LOW (ref 36.0–46.0)
Hemoglobin: 11.8 g/dL — ABNORMAL LOW (ref 12.0–15.0)
Immature Granulocytes: 0 %
Lymphocytes Relative: 24 %
Lymphs Abs: 1.3 10*3/uL (ref 0.7–4.0)
MCH: 30.6 pg (ref 26.0–34.0)
MCHC: 33.4 g/dL (ref 30.0–36.0)
MCV: 91.5 fL (ref 80.0–100.0)
Monocytes Absolute: 0.6 10*3/uL (ref 0.1–1.0)
Monocytes Relative: 10 %
Neutro Abs: 3.6 10*3/uL (ref 1.7–7.7)
Neutrophils Relative %: 64 %
Platelet Count: 662 10*3/uL — ABNORMAL HIGH (ref 150–400)
RBC: 3.86 MIL/uL — ABNORMAL LOW (ref 3.87–5.11)
RDW: 13.3 % (ref 11.5–15.5)
WBC Count: 5.5 10*3/uL (ref 4.0–10.5)
nRBC: 0 % (ref 0.0–0.2)

## 2022-06-06 LAB — CMP (CANCER CENTER ONLY)
ALT: 37 U/L (ref 0–44)
AST: 27 U/L (ref 15–41)
Albumin: 4.6 g/dL (ref 3.5–5.0)
Alkaline Phosphatase: 159 U/L — ABNORMAL HIGH (ref 38–126)
Anion gap: 4 — ABNORMAL LOW (ref 5–15)
BUN: 9 mg/dL (ref 6–20)
CO2: 32 mmol/L (ref 22–32)
Calcium: 9.6 mg/dL (ref 8.9–10.3)
Chloride: 104 mmol/L (ref 98–111)
Creatinine: 0.56 mg/dL (ref 0.44–1.00)
GFR, Estimated: >60 mL/min (ref >60)
Glucose, Bld: 113 mg/dL — ABNORMAL HIGH (ref 70–99)
Potassium: 4.4 mmol/L (ref 3.5–5.1)
Sodium: 140 mmol/L (ref 135–145)
Total Bilirubin: 0.8 mg/dL (ref 0.3–1.2)
Total Protein: 7.4 g/dL (ref 6.5–8.1)

## 2022-06-06 MED ORDER — HYDROXYUREA 500 MG PO CAPS
500.0000 mg | ORAL_CAPSULE | ORAL | 1 refills | Status: DC
  Filled 2022-06-06: qty 90, 30d supply, fill #0
  Filled 2022-12-15: qty 90, 30d supply, fill #1
  Filled 2023-01-12: qty 90, 30d supply, fill #2
  Filled 2023-02-09: qty 90, 30d supply, fill #3

## 2022-06-06 MED ORDER — CETIRIZINE HCL 10 MG PO TABS: 10.0000 mg | ORAL_TABLET | Freq: Every day | ORAL | 5 refills | Status: DC

## 2022-06-06 NOTE — Progress Notes (Signed)
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 Della Goo at [facility] 10/04/2021: establish care with Dr. Lorenso Courier   11/20/2021: Platelet count 1143, starting hydroxyurea 500 mg p.o. daily. 12/13/2021: Plt 985, recommend increasing dosing hydroxyurea to '500mg'$  PO BID 01/24/2022: Plt 766, recommend increasing dosing hydroxyurea to 500 mg in AM and 1000 mg in PM.   Interval History:  Amanda Harris 56 y.o. female with medical history significant for essential thrombocytosis who presents for a follow up visit. The patient's last visit was on 03/28/2022.  In the interim since her last visit she has been taking hydroxyurea once daily with steady decrease in her platelet count.  On exam today Amanda Harris reports she has been "about the same" in the interim since her last visit.  She notes he does have some occasional diarrhea.  She is taking 1 pill in the morning and 2 pills at night.  She reports that she rarely misses a dose but has missed a few doses.  She notes she is not having any nausea or vomiting.  Her headaches have dissipated.  She is eating well and not having any ulcers in her mouth or on her ankles.  She reports that she has had an increase in her pollen allergies.  She is not currently taking any antihistamines for this.  She reports that she is feeling little bit weaker and less energetic from "normal stuff".  She notes in the interim since her last visit she unfortunately has lost her job.  She currently denies any fevers, chills, sweats, nausea, vomiting or diarrhea.  A full 10 point ROS is listed below.   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 FOLLOWED BY DR. NAGALLA AT [facility].   Thrombocytosis     SURGICAL HISTORY: Past Surgical History:  Procedure Laterality Date   COLONOSCOPY W/ BIOPSIES  APRIL 2010   LOW-GRADE NEUROENDOCRINE-CARCINOID TUMOR   TUBAL LIGATION     POST PARTUM    SOCIAL HISTORY: Social History   Socioeconomic History   Marital status: Married    Spouse name: Not on file   Number of children: Not on file   Years of education: Not on file   Highest education level: Not on file  Occupational History   Not on file  Tobacco Use   Smoking status: Never   Smokeless tobacco: Never  Substance and Sexual Activity   Alcohol use: No    Alcohol/week: 0.0 standard drinks of alcohol   Drug use: No   Sexual activity: Yes    Comment: 1st intercourse- 33, partners- 1  Other Topics Concern   Not on file  Social History Narrative   ** Merged History Encounter **       ** Merged History Encounter **       Social Determinants of Health   Financial Resource Strain: Not on file  Food Insecurity: Not on file  Transportation Needs: Not on file  Physical Activity: Not on file  Stress: Not on file  Social Connections: Not on file  Intimate Partner Violence: Not on file    FAMILY HISTORY: Family History  Problem Relation Age of Onset   Cancer Mother        LUNG (NON-SMOKER)   Cancer Sister        LUNG (NON-SMOKER)   Breast cancer Neg Hx     ALLERGIES:  has No Known Allergies.  MEDICATIONS:  Current Outpatient Medications  Medication Sig Dispense Refill   cetirizine (ZYRTEC ALLERGY) 10 MG tablet Take 1 tablet (10 mg total) by mouth daily. 30 tablet 5   Multiple Vitamins-Minerals (ZINC PO) Take 1 tablet by mouth daily at 8 pm.     aspirin EC 81 MG tablet Take 81 mg by mouth daily.     hydroxyurea (HYDREA) 500 MG capsule Take 1  capsule by mouth in the morning and 2 capsules in the evening as directed. May take with food to minimize GI side effects. 270 capsule 1   No current facility-administered medications for this visit.    REVIEW OF SYSTEMS:   Constitutional: ( - ) fevers, ( - )  chills , ( - ) night sweats Eyes: ( - ) blurriness of vision, ( - ) double vision, ( - ) watery eyes Ears, nose, mouth, throat, and face: ( - ) mucositis, ( - ) sore throat Respiratory: ( - ) cough, ( - ) dyspnea, ( - ) wheezes Cardiovascular: ( - ) palpitation, ( - ) chest discomfort, ( - ) lower extremity swelling Gastrointestinal:  ( - ) nausea, ( - ) heartburn, ( - ) change in bowel habits Skin: ( - ) abnormal skin rashes Lymphatics: ( - ) new lymphadenopathy, ( - ) easy bruising Neurological: ( - ) numbness, ( - ) tingling, ( - ) new weaknesses Behavioral/Psych: ( - ) mood change, ( - ) new changes  All other systems were reviewed with the patient and are negative.  PHYSICAL EXAMINATION: ECOG PERFORMANCE STATUS: 1 - Symptomatic but completely ambulatory  Vitals:   06/06/22 1501  BP: 132/78  Pulse: 97  Resp: 17  Temp: (!) 97.3 F (36.3 C)  SpO2: 100%   Filed Weights   06/06/22 1501  Weight: 145 lb 3.2 oz (65.9 kg)    GENERAL: Well-appearing middle-aged female, alert, no distress and comfortable SKIN: skin color, texture, turgor are normal, no rashes or significant lesions EYES: conjunctiva are pink and non-injected, sclera clear LUNGS: clear to auscultation and percussion with normal breathing effort HEART: regular rate & rhythm and no murmurs and no lower extremity edema Musculoskeletal: no cyanosis of digits and no clubbing  PSYCH: alert & oriented x 3, fluent speech NEURO: no focal motor/sensory deficits  LABORATORY DATA:  I have reviewed the data as listed    Latest Ref Rng & Units 06/06/2022    2:47 PM 03/28/2022    1:48 PM 02/07/2022    2:07 PM  CBC  WBC 4.0 - 10.5 K/uL 5.5  4.2  7.0   Hemoglobin 12.0  - 15.0 g/dL 11.8  11.0  13.3   Hematocrit 36.0 - 46.0 % 35.3  33.9  41.0   Platelets 150 - 400 K/uL 662  506  773        Latest Ref Rng & Units 06/06/2022    2:47 PM 03/28/2022    1:48 PM 01/24/2022   10:22 AM  CMP  Glucose 70 - 99 mg/dL 113  95  89   BUN 6 -  20 mg/dL '9  10  10   '$ Creatinine 0.44 - 1.00 mg/dL 0.56  0.62  0.65   Sodium 135 - 145 mmol/L 140  137  137   Potassium 3.5 - 5.1 mmol/L 4.4  4.5  4.9   Chloride 98 - 111 mmol/L 104  103  101   CO2 22 - 32 mmol/L 32  30  31   Calcium 8.9 - 10.3 mg/dL 9.6  9.8  9.8   Total Protein 6.5 - 8.1 g/dL 7.4  7.3  7.3   Total Bilirubin 0.3 - 1.2 mg/dL 0.8  0.6  0.6   Alkaline Phos 38 - 126 U/L 159  102  67   AST 15 - 41 U/L '27  22  19   '$ ALT 0 - 44 U/L 37  25  19     No results found for: "MPROTEIN" No results found for: "KPAFRELGTCHN", "LAMBDASER", "KAPLAMBRATIO"  RADIOGRAPHIC STUDIES: No results found.  ASSESSMENT & PLAN Amanda Harris 56 y.o. female with medical history significant for essential thrombocytosis who presents for a follow up visit.  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 -- After discussion with the patient about the risks and benefits of cytoreductive therapy she was agreeable to proceeding at this time.  I noted that it was unclear if her symptoms were currently related to her thrombocytosis but she wanted to pursue cytoreductive therapy to see if it would be helpful. Plan:  --Recommend continuing hydroxyurea 500 mg in the AM and 1000 mg in the PM p.o. daily --Recommend continuation of aspirin therapy 81 mg p.o. daily alone --labs today show a WBC 5.5, Hgb 11.8, MCV 91.5, Plt 662 --Plan to have patient return to clinic with labs in 3 months with interval labs at 6 weeks.    No orders of the defined types were placed in this encounter.  All questions were answered. The patient knows to call the clinic  with any problems, questions or concerns.  A total of more than 30 minutes were spent on this encounter with face-to-face time and non-face-to-face time, including preparing to see the patient, ordering tests and/or medications, counseling the patient and coordination of care as outlined above.   Ledell Peoples, MD Department of Hematology/Oncology [place] at [facility] Phone: [phone] Pager: [phone] Email: [email or web address]  06/15/2022 7:21 PM

## 2022-06-09 ENCOUNTER — Other Ambulatory Visit (HOSPITAL_COMMUNITY): Payer: Self-pay

## 2022-07-21 ENCOUNTER — Inpatient Hospital Stay: Payer: Self-pay

## 2022-09-05 ENCOUNTER — Other Ambulatory Visit (HOSPITAL_COMMUNITY): Payer: Self-pay

## 2022-09-09 ENCOUNTER — Encounter: Payer: Self-pay | Admitting: *Deleted

## 2022-09-09 ENCOUNTER — Ambulatory Visit: Payer: Self-pay | Admitting: Oncology

## 2022-09-28 NOTE — Progress Notes (Signed)
No-show, will reschedule.

## 2022-09-29 ENCOUNTER — Ambulatory Visit: Payer: Self-pay | Admitting: Hematology and Oncology

## 2022-09-29 ENCOUNTER — Other Ambulatory Visit: Payer: Self-pay

## 2022-10-16 ENCOUNTER — Other Ambulatory Visit: Payer: Self-pay | Admitting: Physician Assistant

## 2022-10-16 DIAGNOSIS — D473 Essential (hemorrhagic) thrombocythemia: Secondary | ICD-10-CM

## 2022-10-17 ENCOUNTER — Other Ambulatory Visit: Payer: Self-pay

## 2022-10-17 ENCOUNTER — Inpatient Hospital Stay: Payer: BLUE CROSS/BLUE SHIELD | Admitting: Physician Assistant

## 2022-10-17 ENCOUNTER — Inpatient Hospital Stay: Payer: BLUE CROSS/BLUE SHIELD | Attending: Physician Assistant

## 2022-10-17 VITALS — BP 114/54 | HR 79 | Temp 98.2°F | Resp 16 | Wt 141.8 lb

## 2022-10-17 DIAGNOSIS — D473 Essential (hemorrhagic) thrombocythemia: Secondary | ICD-10-CM | POA: Diagnosis not present

## 2022-10-17 LAB — CBC WITH DIFFERENTIAL (CANCER CENTER ONLY)
Abs Immature Granulocytes: 0 10*3/uL (ref 0.00–0.07)
Basophils Absolute: 0 10*3/uL (ref 0.0–0.1)
Basophils Relative: 1 %
Eosinophils Absolute: 0 10*3/uL (ref 0.0–0.5)
Eosinophils Relative: 1 %
HCT: 34.6 % — ABNORMAL LOW (ref 36.0–46.0)
Hemoglobin: 11.3 g/dL — ABNORMAL LOW (ref 12.0–15.0)
Immature Granulocytes: 0 %
Lymphocytes Relative: 23 %
Lymphs Abs: 1 10*3/uL (ref 0.7–4.0)
MCH: 30.1 pg (ref 26.0–34.0)
MCHC: 32.7 g/dL (ref 30.0–36.0)
MCV: 92.3 fL (ref 80.0–100.0)
Monocytes Absolute: 0.3 10*3/uL (ref 0.1–1.0)
Monocytes Relative: 7 %
Neutro Abs: 3 10*3/uL (ref 1.7–7.7)
Neutrophils Relative %: 68 %
Platelet Count: 346 10*3/uL (ref 150–400)
RBC: 3.75 MIL/uL — ABNORMAL LOW (ref 3.87–5.11)
RDW: 13.6 % (ref 11.5–15.5)
WBC Count: 4.4 10*3/uL (ref 4.0–10.5)
nRBC: 0 % (ref 0.0–0.2)

## 2022-10-17 LAB — CMP (CANCER CENTER ONLY)
ALT: 14 U/L (ref 0–44)
AST: 17 U/L (ref 15–41)
Albumin: 4.3 g/dL (ref 3.5–5.0)
Alkaline Phosphatase: 80 U/L (ref 38–126)
Anion gap: 5 (ref 5–15)
BUN: 13 mg/dL (ref 6–20)
CO2: 31 mmol/L (ref 22–32)
Calcium: 9.4 mg/dL (ref 8.9–10.3)
Chloride: 104 mmol/L (ref 98–111)
Creatinine: 0.63 mg/dL (ref 0.44–1.00)
GFR, Estimated: >60 mL/min (ref >60)
Glucose, Bld: 108 mg/dL — ABNORMAL HIGH (ref 70–99)
Potassium: 4.5 mmol/L (ref 3.5–5.1)
Sodium: 140 mmol/L (ref 135–145)
Total Bilirubin: 0.7 mg/dL (ref 0.3–1.2)
Total Protein: 6.6 g/dL (ref 6.5–8.1)

## 2022-10-17 NOTE — Progress Notes (Signed)
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 Della Goo at [facility] 10/04/2021: establish care with Dr. Lorenso Courier   11/20/2021: Platelet count 1143, starting hydroxyurea 500 mg p.o. daily. 12/13/2021: Plt 985, recommend increasing dosing hydroxyurea to '500mg'$  PO BID 01/24/2022: Plt 766, recommend increasing dosing hydroxyurea to 500 mg in AM and 1000 mg in PM.   Interval History:  Amanda Harris 57 y.o. female with medical history significant for essential thrombocytosis who presents for a follow up visit. The patient's last visit was on 06/06/2022.  In the interim since her last visit, she continues on hydroxyurea therapy. She is unaccompanied for this visit.   On exam today Amanda Harris reports noticing more fatigue over the last two months. She is able to complete her ADLs on her own. She has a good appetite and denies any weight changes. She denies nausea, vomiting or abdominal pain. She reports occasional episodes of diarrhea, up to 1-2 episodes per week. She denies easy bruising or signs of active bleeding. She has occasional headaches, 1-2 episodes per month that resolves on its own. She denies fevers, chills, sweats, shortness of breath, chest pain or cough. She has no other complaints.   A full 10 point ROS is listed below.   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 FOLLOWED BY DR. NAGALLA AT [facility].   Thrombocytosis     SURGICAL HISTORY: Past Surgical History:  Procedure  Laterality Date   COLONOSCOPY W/ BIOPSIES  APRIL 2010   LOW-GRADE NEUROENDOCRINE-CARCINOID TUMOR   TUBAL LIGATION     POST PARTUM    SOCIAL HISTORY: Social History   Socioeconomic History   Marital status: Married    Spouse name: Not on file   Number of children: Not on file   Years of education: Not on file   Highest education level: Not on file  Occupational History   Not on file  Tobacco Use   Smoking status: Never   Smokeless tobacco: Never  Substance and Sexual Activity   Alcohol use: No    Alcohol/week: 0.0 standard drinks of alcohol   Drug use: No   Sexual activity: Yes    Comment: 1st intercourse- 10, partners- 1  Other Topics Concern   Not on file  Social History Narrative   ** Merged History Encounter **       ** Merged History Encounter **       Social Determinants of Radio broadcast assistant Strain: Not on file  Food Insecurity: Not on file  Transportation Needs: Not on file  Physical Activity: Not on file  Stress: Not on file  Social Connections: Not on file  Intimate Partner Violence: Not on file    FAMILY HISTORY: Family History  Problem Relation Age of Onset   Cancer Mother        LUNG (NON-SMOKER)   Cancer Sister        LUNG (NON-SMOKER)   Breast cancer Neg Hx  ALLERGIES:  has No Known Allergies.  MEDICATIONS:  Current Outpatient Medications  Medication Sig Dispense Refill   aspirin EC 81 MG tablet Take 81 mg by mouth daily.     hydroxyurea (HYDREA) 500 MG capsule Take 1 capsule by mouth in the morning and 2 capsules in the evening as directed. May take with food to minimize GI side effects. 270 capsule 1   Multiple Vitamins-Minerals (ZINC PO) Take 1 tablet by mouth daily at 8 pm.     cetirizine (ZYRTEC ALLERGY) 10 MG tablet Take 1 tablet (10 mg total) by mouth daily. (Patient not taking: Reported on 10/17/2022) 30 tablet 5   No current facility-administered medications for this visit.    REVIEW OF SYSTEMS:   Constitutional: (  - ) fevers, ( - )  chills , ( - ) night sweats Eyes: ( - ) blurriness of vision, ( - ) double vision, ( - ) watery eyes Ears, nose, mouth, throat, and face: ( - ) mucositis, ( - ) sore throat Respiratory: ( - ) cough, ( - ) dyspnea, ( - ) wheezes Cardiovascular: ( - ) palpitation, ( - ) chest discomfort, ( - ) lower extremity swelling Gastrointestinal:  ( - ) nausea, ( - ) heartburn, ( - ) change in bowel habits Skin: ( - ) abnormal skin rashes Lymphatics: ( - ) new lymphadenopathy, ( - ) easy bruising Neurological: ( - ) numbness, ( - ) tingling, ( - ) new weaknesses Behavioral/Psych: ( - ) mood change, ( - ) new changes  All other systems were reviewed with the patient and are negative.  PHYSICAL EXAMINATION: ECOG PERFORMANCE STATUS: 1 - Symptomatic but completely ambulatory  Vitals:   10/17/22 0958  BP: (!) 114/54  Pulse: 79  Resp: 16  Temp: 98.2 F (36.8 C)  SpO2: 100%   Filed Weights   10/17/22 0958  Weight: 141 lb 12.8 oz (64.3 kg)    GENERAL: Well-appearing middle-aged female, alert, no distress and comfortable SKIN: skin color, texture, turgor are normal, no rashes or significant lesions EYES: conjunctiva are pink and non-injected, sclera clear LUNGS: clear to auscultation and percussion with normal breathing effort HEART: regular rate & rhythm and no murmurs and no lower extremity edema Musculoskeletal: no cyanosis of digits and no clubbing  PSYCH: alert & oriented x 3, fluent speech NEURO: no focal motor/sensory deficits  LABORATORY DATA:  I have reviewed the data as listed    Latest Ref Rng & Units 10/17/2022    9:41 AM 06/06/2022    2:47 PM 03/28/2022    1:48 PM  CBC  WBC 4.0 - 10.5 K/uL 4.4  5.5  4.2   Hemoglobin 12.0 - 15.0 g/dL 11.3  11.8  11.0   Hematocrit 36.0 - 46.0 % 34.6  35.3  33.9   Platelets 150 - 400 K/uL 346  662  506        Latest Ref Rng & Units 06/06/2022    2:47 PM 03/28/2022    1:48 PM 01/24/2022   10:22 AM  CMP  Glucose 70 - 99 mg/dL  113  95  89   BUN 6 - 20 mg/dL '9  10  10   '$ Creatinine 0.44 - 1.00 mg/dL 0.56  0.62  0.65   Sodium 135 - 145 mmol/L 140  137  137   Potassium 3.5 - 5.1 mmol/L 4.4  4.5  4.9   Chloride 98 - 111 mmol/L 104  103  101   CO2 22 -  32 mmol/L 32  30  31   Calcium 8.9 - 10.3 mg/dL 9.6  9.8  9.8   Total Protein 6.5 - 8.1 g/dL 7.4  7.3  7.3   Total Bilirubin 0.3 - 1.2 mg/dL 0.8  0.6  0.6   Alkaline Phos 38 - 126 U/L 159  102  67   AST 15 - 41 U/L '27  22  19   '$ ALT 0 - 44 U/L 37  25  19     No results found for: "MPROTEIN" No results found for: "KPAFRELGTCHN", "LAMBDASER", "KAPLAMBRATIO"  RADIOGRAPHIC STUDIES: No results found.  ASSESSMENT & PLAN Amanda Harris is a 57 y.o. female with medical history significant for essential thrombocytosis who presents for a follow up visit.  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 -- After discussion with the patient about the risks and benefits of cytoreductive therapy she was agreeable to proceeding at this time.  I noted that it was unclear if her symptoms were currently related to her thrombocytosis but she wanted to pursue cytoreductive therapy to see if it would be helpful. Plan:  --Currently on hydroxyurea 500 mg in the AM and 1000 mg in the PM p.o. daily --Recommend continuation of aspirin therapy 81 mg p.o. daily alone --labs today show a WBC 4.4, Hgb 11.3, MCV 92.3, Plt 346 --Plan to have patient return to clinic with labs in 3 months   #Fatigue: --Etiology unknown but could be secondary to mild anemia from cytoreductive therapy --Patient reports having history of vitamin D deficiency but hasn't seen her PCP recently since she lost her insurance previously.  --Recommend to re-establish with PCP and undergo evaluation for fatigue including checking vitamin D, thyroid panel, etc. Referral placed today --Consider dose modification for hydrea  if no other etiology is identified for her fatigue.    Orders Placed This Encounter  Procedures   Ambulatory referral to Internal Medicine    Referral Priority:   Urgent    Referral Type:   Consultation    Referral Reason:   Specialty Services Required    Requested Specialty:   Internal Medicine    Number of Visits Requested:   1   All questions were answered. The patient knows to call the clinic with any problems, questions or concerns.  I have spent a total of 30 minutes minutes of face-to-face and non-face-to-face time, preparing to see the patient,  performing a medically appropriate examination, counseling and educating the patient, documenting clinical information in the electronic health record, and care coordination.   Dede Query PA-C Dept of Hematology and [place] at [facility] Phone: [phone]   10/17/2022 10:21 AM

## 2022-10-28 ENCOUNTER — Ambulatory Visit: Payer: Self-pay | Admitting: Physician Assistant

## 2022-10-28 ENCOUNTER — Other Ambulatory Visit: Payer: Self-pay

## 2022-10-31 ENCOUNTER — Ambulatory Visit
Admission: EM | Admit: 2022-10-31 | Discharge: 2022-10-31 | Disposition: A | Discharge status: Home / Self Care | Payer: BLUE CROSS/BLUE SHIELD | Attending: Physician Assistant | Admitting: Physician Assistant

## 2022-10-31 DIAGNOSIS — J0100 Acute maxillary sinusitis, unspecified: Secondary | ICD-10-CM | POA: Diagnosis not present

## 2022-10-31 DIAGNOSIS — Z86010 Personal history of colonic polyps: Secondary | ICD-10-CM | POA: Insufficient documentation

## 2022-10-31 DIAGNOSIS — Z1211 Encounter for screening for malignant neoplasm of colon: Secondary | ICD-10-CM | POA: Insufficient documentation

## 2022-10-31 LAB — POCT INFLUENZA A/B
Influenza A, POC: NEGATIVE
Influenza B, POC: NEGATIVE

## 2022-10-31 MED ORDER — AMOXICILLIN-POT CLAVULANATE 875-125 MG PO TABS: 1.0000 | ORAL_TABLET | Freq: Two times a day (BID) | ORAL | 0 refills | Status: DC

## 2022-10-31 NOTE — ED Triage Notes (Addendum)
Patient presents to UC for sore throat, itchy eyes, and nasal congestion since 2 weeks. She states she felt feverish last night but did not take her temp.  Not treating symptoms with any meds.

## 2022-10-31 NOTE — ED Provider Notes (Signed)
[facility]    CSN: [id number] Arrival date & time: 10/31/22  0900      History   Chief Complaint Chief Complaint  Patient presents with   Sore Throat   Nasal Congestion    HPI Amanda Harris is a 57 y.o. female.   Patient here today for evaluation of sore throat, nasal congestion and sinus pressure that started 2 weeks ago. She reports that she started to feel worse over the last week with worsening the last few days. She has not had known fever but has felt feverish.  She denies cough. She has not had any vomiting or diarrhea.   The history is provided by the patient.  Sore Throat Pertinent negatives include no abdominal pain and no shortness of breath.    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 FOLLOWED BY DR. NAGALLA AT [facility].   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 Authorizing Provider  amoxicillin-clavulanate (AUGMENTIN) 875-125 MG tablet Take 1 tablet by mouth every 12 (twelve) hours. 10/31/22   Francene Finders, PA-C  aspirin EC 81 MG tablet Take 81 mg by mouth daily.    [provider]  cetirizine (ZYRTEC ALLERGY) 10 MG tablet Take 1 tablet (10 mg total) by mouth daily. Patient not taking: Reported on 10/17/2022 06/06/22   Orson Slick, MD  hydroxyurea (HYDREA) 500 MG capsule Take 1 capsule by mouth in the morning and 2 capsules in the evening as directed. May take with food to minimize GI side effects. 06/06/22   Orson Slick, MD  Multiple Vitamins-Minerals (ZINC PO) Take 1 tablet by mouth daily at 8 pm.    [provider]    Family History Family History  Problem Relation Age of Onset   Cancer Mother        LUNG (NON-SMOKER)  Cancer Sister        LUNG (NON-SMOKER)   Breast cancer Neg Hx     Social History Social History   Tobacco Use   Smoking status: Never   Smokeless tobacco: Never  Substance Use Topics   Alcohol use: No    Alcohol/week: 0.0 standard drinks of alcohol   Drug use: No     Allergies   Patient has no known allergies.   Review of Systems Review of Systems  Constitutional:  Positive for chills and fever (subjective).  HENT:  Positive for congestion, sinus pressure and sore throat. Negative for ear pain.   Eyes:  Negative for discharge and redness.  Respiratory:  Negative for cough, shortness of breath and wheezing.   Gastrointestinal:  Negative for abdominal pain, diarrhea, nausea and vomiting.     Physical Exam Triage Vital Signs ED Triage Vitals [10/31/22 1027]  Enc Vitals Group     BP 128/80     Pulse Rate 83     Resp 16     Temp 98 F (36.7 C)     Temp Source Oral     SpO2  98 %     Weight      Height      Head Circumference      Peak Flow      Pain Score 0     Pain Loc      Pain Edu?      Excl. in [place]?    No data found.  Updated Vital Signs BP 128/80 (BP Location: Left Arm)   Pulse 83   Temp 98 F (36.7 C) (Oral)   Resp 16   SpO2 98%      Physical Exam Vitals and nursing note reviewed.  Constitutional:      General: She is not in acute distress.    Appearance: Normal appearance. She is not ill-appearing.  HENT:     Head: Normocephalic and atraumatic.     Right Ear: Tympanic membrane normal.     Left Ear: Tympanic membrane normal.     Nose: Congestion present.     Mouth/Throat:     Mouth: Mucous membranes are moist.     Pharynx: No oropharyngeal exudate or posterior oropharyngeal erythema.     Comments: PND noted Eyes:     Conjunctiva/sclera: Conjunctivae normal.  Cardiovascular:     Rate and Rhythm: Normal rate and regular rhythm.     Heart sounds: Normal heart sounds. No murmur heard. Pulmonary:     Effort: Pulmonary effort is normal. No respiratory distress.     Breath sounds: Normal breath sounds. No wheezing, rhonchi or rales.  Skin:    General: Skin is warm and dry.  Neurological:     Mental Status: She is alert.  Psychiatric:        Mood and Affect: Mood normal.        Thought Content: Thought content normal.      UC Treatments / Results  Labs (all labs ordered are listed, but only abnormal results are displayed) Labs Reviewed  POCT INFLUENZA A/B    EKG   Radiology No results found.  Procedures Procedures (including critical care time)  Medications Ordered in UC Medications - No data to display  Initial Impression / Assessment and Plan / UC Course  I have reviewed the triage vital signs and the nursing notes.  Pertinent labs & imaging results that were available during my care of the patient were reviewed by me and  considered in my medical decision making (see chart for details).    Flu screening  negative. Augmentin prescribed to cover suspected sinusitis. Recommend follow up if no gradual improvement or with any further concerns.   Final Clinical Impressions(s) / UC Diagnoses   Final diagnoses:  Acute maxillary sinusitis, recurrence not specified   Discharge Instructions   None    ED Prescriptions     Medication Sig Dispense Auth. Provider   amoxicillin-clavulanate (AUGMENTIN) 875-125 MG tablet  (Status: Discontinued) Take 1 tablet by mouth every 12 (twelve) hours. 14 tablet Ewell Poe F, PA-C   amoxicillin-clavulanate (AUGMENTIN) 875-125 MG tablet Take 1 tablet by mouth every 12 (twelve) hours. 14 tablet Francene Finders, PA-C      PDMP not reviewed this encounter.   Francene Finders, PA-C 10/31/22 1210

## 2022-12-15 ENCOUNTER — Other Ambulatory Visit (HOSPITAL_COMMUNITY): Payer: Self-pay

## 2023-01-12 ENCOUNTER — Other Ambulatory Visit (HOSPITAL_COMMUNITY): Payer: Self-pay

## 2023-01-13 ENCOUNTER — Telehealth: Payer: Self-pay | Admitting: Hematology and Oncology

## 2023-01-13 NOTE — Telephone Encounter (Signed)
Patient called to reschedule 4/5 appointment due to cost of previous appointments. Scheduled out for patient to be able to pay bill if insurance is still having issues.

## 2023-01-16 ENCOUNTER — Ambulatory Visit: Payer: BLUE CROSS/BLUE SHIELD | Admitting: Hematology and Oncology

## 2023-01-16 ENCOUNTER — Other Ambulatory Visit: Payer: BLUE CROSS/BLUE SHIELD

## 2023-02-09 ENCOUNTER — Other Ambulatory Visit (HOSPITAL_COMMUNITY): Payer: Self-pay

## 2023-03-20 ENCOUNTER — Inpatient Hospital Stay: Payer: Self-pay | Attending: Hematology and Oncology

## 2023-03-20 ENCOUNTER — Other Ambulatory Visit: Payer: Self-pay | Admitting: Hematology and Oncology

## 2023-03-20 ENCOUNTER — Inpatient Hospital Stay: Payer: Self-pay | Admitting: Hematology and Oncology

## 2023-03-20 DIAGNOSIS — D473 Essential (hemorrhagic) thrombocythemia: Secondary | ICD-10-CM

## 2023-03-20 NOTE — Progress Notes (Unsigned)
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 Alinda Harris at [facility] 10/04/2021: establish care with Dr. Leonides Harris   11/20/2021: Platelet count 1143, starting hydroxyurea 500 mg p.o. daily. 12/13/2021: Plt 985, recommend increasing dosing hydroxyurea to 500mg  PO BID 01/24/2022: Plt 766, recommend increasing dosing hydroxyurea to 500 mg in AM and 1000 mg in PM.   Interval History:  Amanda Harris 57 y.o. female with medical history significant for essential thrombocytosis who presents for a follow up visit. The patient's last visit was on 09/29/2022.  In the interim since her last visit, she continues on hydroxyurea therapy. She is unaccompanied for this visit.   On exam today Amanda Harris reports ***. She denies fevers, chills, sweats, shortness of breath, chest pain or cough. She has no other complaints.    A full 10 point ROS is listed below.    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 FOLLOWED BY Amanda Harris AT [facility].   Thrombocytosis     SURGICAL HISTORY: Past Surgical History:  Procedure Laterality Date   COLONOSCOPY W/ BIOPSIES  APRIL 2010   LOW-GRADE NEUROENDOCRINE-CARCINOID TUMOR   TUBAL LIGATION     POST PARTUM    SOCIAL HISTORY: Social History   Socioeconomic History   Marital status: Married    Spouse name: Not on file   Number of children: Not on file   Years of education: Not on file   Highest education level: Not on file  Occupational History   Not  on file  Tobacco Use   Smoking status: Never   Smokeless tobacco: Never  Substance and Sexual Activity   Alcohol use: No    Alcohol/week: 0.0 standard drinks of alcohol   Drug use: No   Sexual activity: Yes    Comment: 1st intercourse- 21, partners- 1  Other Topics Concern   Not on file  Social History Narrative   ** Merged History Encounter **       ** Merged History Encounter **       Social Determinants of Corporate investment banker Strain: Not on file  Food Insecurity: Not on file  Transportation Needs: Not on file  Physical Activity: Not on file  Stress: Not on file  Social Connections: Not on file  Intimate Partner Violence: Not on file    FAMILY HISTORY: Family History  Problem Relation Age of Onset   Cancer Mother        LUNG (NON-SMOKER)   Cancer Sister        LUNG (NON-SMOKER)   Breast cancer Neg Hx     ALLERGIES:  has No Known Allergies.  MEDICATIONS:  Current Outpatient Medications  Medication Sig Dispense Refill   amoxicillin-clavulanate (AUGMENTIN) 875-125 MG tablet Take 1 tablet by mouth every 12 (twelve) hours. 14 tablet 0   aspirin EC 81 MG tablet Take 81 mg by mouth daily.     cetirizine (ZYRTEC ALLERGY) 10 MG tablet Take 1 tablet (10 mg total)  by mouth daily. (Patient not taking: Reported on 10/17/2022) 30 tablet 5   hydroxyurea (HYDREA) 500 MG capsule Take 1 capsule by mouth in the morning and 2 capsules in the evening as directed. May take with food to minimize GI side effects. 270 capsule 1   Multiple Vitamins-Minerals (ZINC PO) Take 1 tablet by mouth daily at 8 pm.     No current facility-administered medications for this visit.    REVIEW OF SYSTEMS:   Constitutional: ( - ) fevers, ( - )  chills , ( - ) night sweats Eyes: ( - ) blurriness of vision, ( - ) double vision, ( - ) watery eyes Ears, nose, mouth, throat, and face: ( - ) mucositis, ( - ) sore throat Respiratory: ( - ) cough, ( - ) dyspnea, ( - ) wheezes Cardiovascular: ( - )  palpitation, ( - ) chest discomfort, ( - ) lower extremity swelling Gastrointestinal:  ( - ) nausea, ( - ) heartburn, ( - ) change in bowel habits Skin: ( - ) abnormal skin rashes Lymphatics: ( - ) new lymphadenopathy, ( - ) easy bruising Neurological: ( - ) numbness, ( - ) tingling, ( - ) new weaknesses Behavioral/Psych: ( - ) mood change, ( - ) new changes  All other systems were reviewed with the patient and are negative.  PHYSICAL EXAMINATION: ECOG PERFORMANCE STATUS: 1 - Symptomatic but completely ambulatory  There were no vitals filed for this visit.  There were no vitals filed for this visit.   GENERAL: Well-appearing middle-aged female, alert, no distress and comfortable SKIN: skin color, texture, turgor are normal, no rashes or significant lesions EYES: conjunctiva are pink and non-injected, sclera clear LUNGS: clear to auscultation and percussion with normal breathing effort HEART: regular rate & rhythm and no murmurs and no lower extremity edema Musculoskeletal: no cyanosis of digits and no clubbing  PSYCH: alert & oriented x 3, fluent speech NEURO: no focal motor/sensory deficits  LABORATORY DATA:  I have reviewed the data as listed    Latest Ref Rng & Units 10/17/2022    9:41 AM 06/06/2022    2:47 PM 03/28/2022    1:48 PM  CBC  WBC 4.0 - 10.5 K/uL 4.4  5.5  4.2   Hemoglobin 12.0 - 15.0 g/dL 16.1  09.6  04.5   Hematocrit 36.0 - 46.0 % 34.6  35.3  33.9   Platelets 150 - 400 K/uL 346  662  506        Latest Ref Rng & Units 10/17/2022    9:41 AM 06/06/2022    2:47 PM 03/28/2022    1:48 PM  CMP  Glucose 70 - 99 mg/dL 409  811  95   BUN 6 - 20 mg/dL 13  9  10    Creatinine 0.44 - 1.00 mg/dL 9.14  7.82  9.56   Sodium 135 - 145 mmol/L 140  140  137   Potassium 3.5 - 5.1 mmol/L 4.5  4.4  4.5   Chloride 98 - 111 mmol/L 104  104  103   CO2 22 - 32 mmol/L 31  32  30   Calcium 8.9 - 10.3 mg/dL 9.4  9.6  9.8   Total Protein 6.5 - 8.1 g/dL 6.6  7.4  7.3   Total Bilirubin  0.3 - 1.2 mg/dL 0.7  0.8  0.6   Alkaline Phos 38 - 126 U/L 80  159  102   AST 15 - 41 U/L 17  27  22   ALT 0 - 44 U/L 14  37  25     No results found for: "MPROTEIN" No results found for: "KPAFRELGTCHN", "LAMBDASER", "KAPLAMBRATIO"  RADIOGRAPHIC STUDIES: No results found.  ASSESSMENT & PLAN Amanda Harris is a 57 y.o. female with medical history significant for essential thrombocytosis who presents for a follow up visit.  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 -- After discussion with the patient about the risks and benefits of cytoreductive therapy she was agreeable to proceeding at this time.  I noted that it was unclear if her symptoms were currently related to her thrombocytosis but she wanted to pursue cytoreductive therapy to see if it would be helpful. Plan:  --Currently on hydroxyurea 500 mg in the AM and 1000 mg in the PM p.o. daily --Recommend continuation of aspirin therapy 81 mg p.o. daily alone --labs today show a WBC *** --Plan to have patient return to clinic with labs in 3 months   #Fatigue: --Etiology unknown but could be secondary to mild anemia from cytoreductive therapy --Patient reports having history of vitamin D deficiency but hasn't seen her PCP recently since she lost her insurance previously.  --Recommend to re-establish with PCP and undergo evaluation for fatigue including checking vitamin D, thyroid panel, etc. Referral placed today --Consider dose modification for hydrea if no other etiology is identified for her fatigue.    No orders of the defined types were placed in this encounter.  All questions were answered. The patient knows to call the clinic with any problems, questions or concerns.  I have spent a total of 30 minutes minutes of face-to-face and non-face-to-face time, preparing to see the patient,  performing a medically appropriate  examination, counseling and educating the patient, documenting clinical information in the electronic health record, and care coordination.   Ulysees Barns, MD Department of Hematology/Oncology [facility] at [facility] Phone: [phone] Pager: [phone] Email: [email or web address]    03/20/2023 8:40 AM

## 2023-03-23 ENCOUNTER — Telehealth: Payer: Self-pay | Admitting: Hematology and Oncology

## 2023-08-17 ENCOUNTER — Encounter: Payer: Self-pay | Admitting: Hematology and Oncology

## 2023-08-17 NOTE — Telephone Encounter (Signed)
Called patient and she wanted to know how much her visit would be, advise as self pay status, there would be a 60% discount for self pay. Patient refuse to schedule unsure if she can afford it. Patient states allow her to call us back to schedule  if she can afford the visit. I advise of other payment options and provided the scheduling number to call back.

## 2023-08-25 ENCOUNTER — Other Ambulatory Visit (HOSPITAL_COMMUNITY): Payer: Self-pay

## 2023-08-25 ENCOUNTER — Other Ambulatory Visit: Payer: Self-pay | Admitting: Hematology and Oncology

## 2023-08-25 MED ORDER — HYDROXYUREA 500 MG PO CAPS
500.0000 mg | ORAL_CAPSULE | ORAL | 0 refills | Status: DC
  Filled 2023-08-25: qty 172, 58d supply, fill #0
  Filled 2023-08-26: qty 98, 32d supply, fill #0

## 2023-08-26 ENCOUNTER — Other Ambulatory Visit (HOSPITAL_COMMUNITY): Payer: Self-pay

## 2023-08-26 ENCOUNTER — Other Ambulatory Visit: Payer: Self-pay

## 2023-08-27 ENCOUNTER — Other Ambulatory Visit (HOSPITAL_COMMUNITY): Payer: Self-pay

## 2023-08-28 ENCOUNTER — Inpatient Hospital Stay: Payer: Self-pay

## 2023-08-28 ENCOUNTER — Inpatient Hospital Stay: Payer: Self-pay | Attending: Hematology and Oncology | Admitting: Hematology and Oncology

## 2023-08-28 ENCOUNTER — Other Ambulatory Visit (HOSPITAL_COMMUNITY): Payer: Self-pay

## 2023-08-28 VITALS — BP 136/72 | HR 61 | Temp 97.7°F | Resp 13 | Wt 146.9 lb

## 2023-08-28 DIAGNOSIS — Z79899 Other long term (current) drug therapy: Secondary | ICD-10-CM | POA: Insufficient documentation

## 2023-08-28 DIAGNOSIS — D473 Essential (hemorrhagic) thrombocythemia: Secondary | ICD-10-CM | POA: Insufficient documentation

## 2023-08-28 LAB — CBC WITH DIFFERENTIAL (CANCER CENTER ONLY)
Abs Immature Granulocytes: 0.01 10*3/uL (ref 0.00–0.07)
Basophils Absolute: 0.1 10*3/uL (ref 0.0–0.1)
Basophils Relative: 1 %
Eosinophils Absolute: 0.2 10*3/uL (ref 0.0–0.5)
Eosinophils Relative: 3 %
HCT: 39.3 % (ref 36.0–46.0)
Hemoglobin: 12.4 g/dL (ref 12.0–15.0)
Immature Granulocytes: 0 %
Lymphocytes Relative: 31 %
Lymphs Abs: 1.9 10*3/uL (ref 0.7–4.0)
MCH: 27.2 pg (ref 26.0–34.0)
MCHC: 31.6 g/dL (ref 30.0–36.0)
MCV: 86.2 fL (ref 80.0–100.0)
Monocytes Absolute: 0.6 10*3/uL (ref 0.1–1.0)
Monocytes Relative: 9 %
Neutro Abs: 3.5 10*3/uL (ref 1.7–7.7)
Neutrophils Relative %: 56 %
Platelet Count: 897 10*3/uL — ABNORMAL HIGH (ref 150–400)
RBC: 4.56 MIL/uL (ref 3.87–5.11)
RDW: 12.5 % (ref 11.5–15.5)
WBC Count: 6.2 10*3/uL (ref 4.0–10.5)
nRBC: 0 % (ref 0.0–0.2)

## 2023-08-28 LAB — CMP (CANCER CENTER ONLY)
ALT: 50 U/L — ABNORMAL HIGH (ref 0–44)
AST: 28 U/L (ref 15–41)
Albumin: 4.5 g/dL (ref 3.5–5.0)
Alkaline Phosphatase: 123 U/L (ref 38–126)
Anion gap: 3 — ABNORMAL LOW (ref 5–15)
BUN: 9 mg/dL (ref 6–20)
CO2: 31 mmol/L (ref 22–32)
Calcium: 9.5 mg/dL (ref 8.9–10.3)
Chloride: 102 mmol/L (ref 98–111)
Creatinine: 0.61 mg/dL (ref 0.44–1.00)
GFR, Estimated: >60 mL/min (ref >60)
Glucose, Bld: 101 mg/dL — ABNORMAL HIGH (ref 70–99)
Potassium: 5.1 mmol/L (ref 3.5–5.1)
Sodium: 136 mmol/L (ref 135–145)
Total Bilirubin: 0.7 mg/dL (ref <1.2)
Total Protein: 7.5 g/dL (ref 6.5–8.1)

## 2023-08-28 NOTE — Progress Notes (Signed)
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 Alinda Money at [facility] 10/04/2021: establish care with Dr. Leonides Schanz   11/20/2021: Platelet count 1143, starting hydroxyurea 500 mg p.o. daily. 12/13/2021: Plt 985, recommend increasing dosing hydroxyurea to 500mg  PO BID 01/24/2022: Plt 766, recommend increasing dosing hydroxyurea to 500 mg in AM and 1000 mg in PM.   Interval History:  Amanda Harris 57 y.o. female with medical history significant for essential thrombocytosis who presents for a follow up visit. The patient's last visit was on 06/06/2022.  In the interim since her last visit she has not been taking her hydroxyurea and has been off the medication for 4 months time.  On exam today Amanda Harris reports it has been a rough year for her.  She reports she lost her job at a company she worked for for 17 years.  As result she lost her insurance.  She did pick up a new insurance but unfortunately it did not cover her visit here and she was hit with a bill.  She reports that she is currently looking towards getting a new insurance that should cover our visits early next year.  She reports that she does notice a difference when she is not taking her hydroxyurea.  She is having more frequent headaches and leg aches and soreness.  She also reports that her feet feel cold.  She has been continuing to take her aspirin 81 mg p.o. daily with no bleeding, bruising, or dark stools.  Overall she notes that she is willing and able to restart her hydroxyurea therapy at our prior dose of one 500 mg pill in the morning and 2 pills in the evening.  We will plan to have her check back for labs in 3 months time in February when her new insurance is active..  She currently denies any  fevers, chills, sweats, nausea, vomiting or diarrhea.  A full 10 point ROS is listed below.   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 MANN   Kidney stones    Mastodynia    Myeloproliferative disease (HCC)    JAK2 MUTATION ... FOLLOWED BY DR. NAGALLA AT [facility].   Thrombocytosis     SURGICAL HISTORY: Past Surgical History:  Procedure Laterality Date   COLONOSCOPY W/ BIOPSIES  APRIL 2010   LOW-GRADE NEUROENDOCRINE-CARCINOID TUMOR   TUBAL LIGATION     POST PARTUM    SOCIAL HISTORY: Social History   Socioeconomic History   Marital status: Married    Spouse name: Not on file   Number of children: Not on file   Years of education: Not on file   Highest education level: Not on file  Occupational History   Not on file  Tobacco Use   Smoking status: Never   Smokeless tobacco: Never  Substance and Sexual Activity   Alcohol use: No    Alcohol/week: 0.0 standard drinks of alcohol   Drug use: No   Sexual activity: Yes    Comment: 1st intercourse- 21, partners- 1  Other Topics Concern   Not on file  Social History Narrative   **  Merged History Encounter **       ** Merged History Encounter **       Social Determinants of Health   Financial Resource Strain: Not on file  Food Insecurity: Not on file  Transportation Needs: Not on file  Physical Activity: Not on file  Stress: Not on file  Social Connections: Unknown (02/21/2022)   Received from [facility], [facility]   Social Network    Social Network: Not on file  Intimate Partner Violence: Unknown (01/13/2022)   Received from [facility], [facility]   HITS    Physically Hurt: Not on file    Insult or Talk Down To: Not on file    Threaten Physical Harm: Not on file    Scream or Curse: Not on file    FAMILY HISTORY: Family  History  Problem Relation Age of Onset   Cancer Mother        LUNG (NON-SMOKER)   Cancer Sister        LUNG (NON-SMOKER)   Breast cancer Neg Hx     ALLERGIES:  has No Known Allergies.  MEDICATIONS:  Current Outpatient Medications  Medication Sig Dispense Refill   amoxicillin-clavulanate (AUGMENTIN) 875-125 MG tablet Take 1 tablet by mouth every 12 (twelve) hours. 14 tablet 0   aspirin EC 81 MG tablet Take 81 mg by mouth daily.     cetirizine (ZYRTEC ALLERGY) 10 MG tablet Take 1 tablet (10 mg total) by mouth daily. (Patient not taking: Reported on 10/17/2022) 30 tablet 5   hydroxyurea (HYDREA) 500 MG capsule Take 1 capsule by mouth in the morning and 2 capsules in the evening as directed. May take with food to minimize GI side effects. 270 capsule 0   Multiple Vitamins-Minerals (ZINC PO) Take 1 tablet by mouth daily at 8 pm.     No current facility-administered medications for this visit.    REVIEW OF SYSTEMS:   Constitutional: ( - ) fevers, ( - )  chills , ( - ) night sweats Eyes: ( - ) blurriness of vision, ( - ) double vision, ( - ) watery eyes Ears, nose, mouth, throat, and face: ( - ) mucositis, ( - ) sore throat Respiratory: ( - ) cough, ( - ) dyspnea, ( - ) wheezes Cardiovascular: ( - ) palpitation, ( - ) chest discomfort, ( - ) lower extremity swelling Gastrointestinal:  ( - ) nausea, ( - ) heartburn, ( - ) change in bowel habits Skin: ( - ) abnormal skin rashes Lymphatics: ( - ) new lymphadenopathy, ( - ) easy bruising Neurological: ( - ) numbness, ( - ) tingling, ( - ) new weaknesses Behavioral/Psych: ( - ) mood change, ( - ) new changes  All other systems were reviewed with the patient and are negative.  PHYSICAL EXAMINATION: ECOG PERFORMANCE STATUS: 1 - Symptomatic but completely ambulatory  There were no vitals filed for this visit.  There were no vitals filed for this visit.   GENERAL: Well-appearing middle-aged female, alert, no distress and comfortable SKIN:  skin color, texture, turgor are normal, no rashes or significant lesions EYES: conjunctiva are pink and non-injected, sclera clear LUNGS: clear to auscultation and percussion with normal breathing effort HEART: regular rate & rhythm and no murmurs and no lower extremity edema Musculoskeletal: no cyanosis of digits and no clubbing  PSYCH: alert & oriented x 3, fluent speech NEURO: no focal motor/sensory deficits  LABORATORY DATA:  I have reviewed the data as listed  Latest Ref Rng & Units 10/17/2022    9:41 AM 06/06/2022    2:47 PM 03/28/2022    1:48 PM  CBC  WBC 4.0 - 10.5 K/uL 4.4  5.5  4.2   Hemoglobin 12.0 - 15.0 g/dL 23.5  57.3  22.0   Hematocrit 36.0 - 46.0 % 34.6  35.3  33.9   Platelets 150 - 400 K/uL 346  662  506        Latest Ref Rng & Units 10/17/2022    9:41 AM 06/06/2022    2:47 PM 03/28/2022    1:48 PM  CMP  Glucose 70 - 99 mg/dL 254  270  95   BUN 6 - 20 mg/dL 13  9  10    Creatinine 0.44 - 1.00 mg/dL 6.23  7.62  8.31   Sodium 135 - 145 mmol/L 140  140  137   Potassium 3.5 - 5.1 mmol/L 4.5  4.4  4.5   Chloride 98 - 111 mmol/L 104  104  103   CO2 22 - 32 mmol/L 31  32  30   Calcium 8.9 - 10.3 mg/dL 9.4  9.6  9.8   Total Protein 6.5 - 8.1 g/dL 6.6  7.4  7.3   Total Bilirubin 0.3 - 1.2 mg/dL 0.7  0.8  0.6   Alkaline Phos 38 - 126 U/L 80  159  102   AST 15 - 41 U/L 17  27  22    ALT 0 - 44 U/L 14  37  25     No results found for: "MPROTEIN" No results found for: "KPAFRELGTCHN", "LAMBDASER", "KAPLAMBRATIO"  RADIOGRAPHIC STUDIES: No results found.  ASSESSMENT & PLAN Amanda Harris 57 y.o. female with medical history significant for essential thrombocytosis who presents for a follow up visit.  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 -- After discussion with the patient about the risks and benefits of cytoreductive therapy she was agreeable to proceeding at  this time.  I noted that it was unclear if her symptoms were currently related to her thrombocytosis but she wanted to pursue cytoreductive therapy to see if it would be helpful. Plan:  --Recommend restarting hydroxyurea 500 mg in the AM and 1000 mg in the PM p.o. daily --Recommend continuation of aspirin therapy 81 mg p.o. daily alone --labs today show a WBC 6.2, hemoglobin 12.4, MCV 86.2, platelets 897 --Plan to have patient return to clinic with labs in 3 months    No orders of the defined types were placed in this encounter.  All questions were answered. The patient knows to call the clinic with any problems, questions or concerns.  A total of more than 25 minutes were spent on this encounter with face-to-face time and non-face-to-face time, including preparing to see the patient, ordering tests and/or medications, counseling the patient and coordination of care as outlined above.   Ulysees Barns, MD Department of Hematology/Oncology [facility] at [facility] Phone: [phone] Pager: [phone] Email: [email or web address]  08/28/2023 7:38 AM

## 2023-08-31 ENCOUNTER — Telehealth: Payer: Self-pay | Admitting: Hematology and Oncology

## 2023-08-31 NOTE — Telephone Encounter (Signed)
Patient is aware of scheduled appointment times/dates

## 2023-09-03 ENCOUNTER — Ambulatory Visit: Payer: Self-pay | Admitting: Hematology and Oncology

## 2023-10-22 ENCOUNTER — Encounter: Payer: Self-pay | Admitting: Internal Medicine

## 2023-10-22 ENCOUNTER — Ambulatory Visit (INDEPENDENT_AMBULATORY_CARE_PROVIDER_SITE_OTHER): Payer: 59 | Admitting: Internal Medicine

## 2023-10-22 ENCOUNTER — Other Ambulatory Visit (HOSPITAL_COMMUNITY): Payer: Self-pay

## 2023-10-22 VITALS — BP 124/76 | HR 106 | Temp 98.5°F | Ht 63.0 in | Wt 148.3 lb

## 2023-10-22 DIAGNOSIS — J101 Influenza due to other identified influenza virus with other respiratory manifestations: Secondary | ICD-10-CM

## 2023-10-22 DIAGNOSIS — D473 Essential (hemorrhagic) thrombocythemia: Secondary | ICD-10-CM

## 2023-10-22 DIAGNOSIS — R059 Cough, unspecified: Secondary | ICD-10-CM | POA: Diagnosis not present

## 2023-10-22 LAB — POC COVID19 BINAXNOW: SARS Coronavirus 2 Ag: NEGATIVE

## 2023-10-22 LAB — POCT INFLUENZA A/B
Influenza A, POC: POSITIVE — AB
Influenza B, POC: NEGATIVE

## 2023-10-22 MED ORDER — OSELTAMIVIR PHOSPHATE 75 MG PO CAPS
75.0000 mg | ORAL_CAPSULE | Freq: Two times a day (BID) | ORAL | 0 refills | Status: AC
  Filled 2023-10-22: qty 10, 5d supply, fill #0

## 2023-10-22 NOTE — Progress Notes (Signed)
 New Patient Office Visit     CC/Reason for Visit: Establish care, URI symptoms Previous PCP: Unknown Last Visit: Unknown  HPI: Amanda Harris is a 58 y.o. female who is coming in today for the above mentioned reasons. Past Medical History is significant for: Essential thrombocytosis who is needing a referral back to Dr. Federico at the cancer center.  She is scheduled this appointment as a new patient appointment.  However upon rooming informs us  that she has been having coughing, postnasal drip, sore throat.  She has subsequently tested positive for influenza A and because of this we will reschedule her new patient appointment.  No fever, no shortness of breath.   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 MANN   Kidney stones    Mastodynia    Myeloproliferative disease (HCC)    JAK2 MUTATION ... FOLLOWED BY DR. NAGALLA AT [facility].   Thrombocytosis     Past Surgical History:  Procedure Laterality Date   COLONOSCOPY W/ BIOPSIES  APRIL 2010   LOW-GRADE NEUROENDOCRINE-CARCINOID TUMOR   TUBAL LIGATION     POST PARTUM    Social History:  reports that she has never smoked. She has never used smokeless tobacco. She reports that she does not drink alcohol and does not use drugs.  Allergies: No Known Allergies  Family History:  Family History  Problem Relation Age of Onset   Cancer Mother        LUNG (NON-SMOKER)   Cancer Sister        LUNG (NON-SMOKER)   Breast cancer Neg Hx      Current Outpatient Medications:    aspirin EC 81 MG tablet, Take 81 mg by mouth daily., Disp: , Rfl:    hydroxyurea  (HYDREA ) 500 MG capsule, Take 1 capsule by mouth in the morning and 2 capsules in the evening as directed. May take with food to minimize GI side effects., Disp: 270  capsule, Rfl: 0   Multiple Vitamins-Minerals (ZINC PO), Take 1 tablet by mouth daily at 8 pm., Disp: , Rfl:    oseltamivir  (TAMIFLU ) 75 MG capsule, Take 1 capsule (75 mg total) by mouth 2 (two) times daily for 5 days., Disp: 10 capsule, Rfl: 0  Review of Systems:  Negative except as indicated in HPI.   Physical Exam: Vitals:   10/22/23 1438  BP: 124/76  Pulse: (!) 106  Temp: 98.5 F (36.9 C)  TempSrc: Oral  SpO2: 96%  Weight: 148 lb 4.8 oz (67.3 kg)  Height: 5' 3 (1.6 m)   Body mass index is 26.27 kg/m.  Physical Exam Vitals reviewed.  Constitutional:      Appearance: Normal appearance.  HENT:     Right Ear: Tympanic membrane, ear canal and external ear normal.     Left Ear: Tympanic membrane, ear canal and external ear normal.     Mouth/Throat:     Mouth: Mucous membranes are moist.     Pharynx: Oropharynx is clear.  Eyes:     Conjunctiva/sclera: Conjunctivae normal.     Pupils: Pupils are equal, round, and reactive to light.  Cardiovascular:     Rate and Rhythm: Normal rate and regular rhythm.  Pulmonary:     Effort: Pulmonary effort is normal.     Breath  sounds: Normal breath sounds.  Neurological:     Mental Status: She is alert.       Impression and Plan:  Influenza A -     Oseltamivir  Phosphate; Take 1 capsule (75 mg total) by mouth 2 (two) times daily for 5 days.  Dispense: 10 capsule; Refill: 0  Cough, unspecified type -     POC COVID-19 BinaxNow -     POCT Influenza A/B  Essential thrombocytosis (HCC) -     Ambulatory referral to Hematology / Oncology    -Tamiflu  75 mg twice daily for 5 days for her influenza A. -Referral placed per request to Dr. Lafonda office.  Time spent: 31 minutes reviewing chart, interviewing and examining patient, formulating plan of care        Amanda Obremski Theophilus Andrews, MD [facility]

## 2023-10-23 ENCOUNTER — Encounter: Payer: Self-pay | Admitting: Hematology and Oncology

## 2023-11-03 ENCOUNTER — Ambulatory Visit (INDEPENDENT_AMBULATORY_CARE_PROVIDER_SITE_OTHER): Payer: 59 | Admitting: Internal Medicine

## 2023-11-03 ENCOUNTER — Encounter: Payer: Self-pay | Admitting: Internal Medicine

## 2023-11-03 VITALS — BP 130/84 | Ht 62.0 in | Wt 143.6 lb

## 2023-11-03 DIAGNOSIS — Z124 Encounter for screening for malignant neoplasm of cervix: Secondary | ICD-10-CM | POA: Diagnosis not present

## 2023-11-03 DIAGNOSIS — H539 Unspecified visual disturbance: Secondary | ICD-10-CM | POA: Diagnosis not present

## 2023-11-03 DIAGNOSIS — Z8639 Personal history of other endocrine, nutritional and metabolic disease: Secondary | ICD-10-CM | POA: Diagnosis not present

## 2023-11-03 DIAGNOSIS — Z0000 Encounter for general adult medical examination without abnormal findings: Secondary | ICD-10-CM | POA: Diagnosis not present

## 2023-11-03 DIAGNOSIS — K635 Polyp of colon: Secondary | ICD-10-CM

## 2023-11-03 DIAGNOSIS — Z1231 Encounter for screening mammogram for malignant neoplasm of breast: Secondary | ICD-10-CM

## 2023-11-03 DIAGNOSIS — D473 Essential (hemorrhagic) thrombocythemia: Secondary | ICD-10-CM | POA: Diagnosis not present

## 2023-11-03 DIAGNOSIS — L600 Ingrowing nail: Secondary | ICD-10-CM

## 2023-11-03 LAB — CBC WITH DIFFERENTIAL/PLATELET
Basophils Absolute: 0.1 10*3/uL (ref 0.0–0.1)
Basophils Relative: 0.8 % (ref 0.0–3.0)
Eosinophils Absolute: 0 10*3/uL (ref 0.0–0.7)
Eosinophils Relative: 0.7 % (ref 0.0–5.0)
HCT: 40.4 % (ref 36.0–46.0)
Hemoglobin: 13 g/dL (ref 12.0–15.0)
Lymphocytes Relative: 26.6 % (ref 12.0–46.0)
Lymphs Abs: 1.9 10*3/uL (ref 0.7–4.0)
MCHC: 32.3 g/dL (ref 30.0–36.0)
MCV: 84.5 fL (ref 78.0–100.0)
Monocytes Absolute: 0.6 10*3/uL (ref 0.1–1.0)
Monocytes Relative: 8.1 % (ref 3.0–12.0)
Neutro Abs: 4.5 10*3/uL (ref 1.4–7.7)
Neutrophils Relative %: 63.8 % (ref 43.0–77.0)
Platelets: 881 10*3/uL — ABNORMAL HIGH (ref 150.0–400.0)
RBC: 4.78 Mil/uL (ref 3.87–5.11)
RDW: 17 % — ABNORMAL HIGH (ref 11.5–15.5)
WBC: 7.1 10*3/uL (ref 4.0–10.5)

## 2023-11-03 LAB — COMPREHENSIVE METABOLIC PANEL
ALT: 28 U/L (ref 0–35)
AST: 21 U/L (ref 0–37)
Albumin: 4.8 g/dL (ref 3.5–5.2)
Alkaline Phosphatase: 88 U/L (ref 39–117)
BUN: 12 mg/dL (ref 6–23)
CO2: 29 meq/L (ref 19–32)
Calcium: 9.9 mg/dL (ref 8.4–10.5)
Chloride: 102 meq/L (ref 96–112)
Creatinine, Ser: 0.57 mg/dL (ref 0.40–1.20)
GFR: 101.79 mL/min (ref >60.00)
Glucose, Bld: 97 mg/dL (ref 70–99)
Potassium: 4.8 meq/L (ref 3.5–5.1)
Sodium: 139 meq/L (ref 135–145)
Total Bilirubin: 0.6 mg/dL (ref 0.2–1.2)
Total Protein: 7.6 g/dL (ref 6.0–8.3)

## 2023-11-03 LAB — URINALYSIS, ROUTINE W REFLEX MICROSCOPIC
Bilirubin Urine: NEGATIVE
Ketones, ur: NEGATIVE
Leukocytes,Ua: NEGATIVE
Nitrite: NEGATIVE
Specific Gravity, Urine: <=1.005 — AB (ref 1.000–1.030)
Total Protein, Urine: NEGATIVE
Urine Glucose: NEGATIVE
Urobilinogen, UA: 0.2 (ref 0.0–1.0)
pH: 6.5 (ref 5.0–8.0)

## 2023-11-03 LAB — LIPID PANEL
Cholesterol: 191 mg/dL (ref 0–200)
HDL: 50.4 mg/dL (ref >39.00)
LDL Cholesterol: 121 mg/dL — ABNORMAL HIGH (ref 0–99)
NonHDL: 140.26
Total CHOL/HDL Ratio: 4
Triglycerides: 98 mg/dL (ref 0.0–149.0)
VLDL: 19.6 mg/dL (ref 0.0–40.0)

## 2023-11-03 LAB — VITAMIN B12: Vitamin B-12: 1059 pg/mL — ABNORMAL HIGH (ref 211–911)

## 2023-11-03 LAB — TSH: TSH: 0.62 u[IU]/mL (ref 0.35–5.50)

## 2023-11-03 LAB — VITAMIN D 25 HYDROXY (VIT D DEFICIENCY, FRACTURES): VITD: 30.51 ng/mL (ref 30.00–100.00)

## 2023-11-03 NOTE — Progress Notes (Signed)
Established Patient Office Visit     CC/Reason for Visit: Annual preventive exam  HPI: Amanda Harris is a 58 y.o. female who is coming in today for the above mentioned reasons. 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  Also has a history of colon polyps and is due for her repeat colonoscopy requesting referral.  Requesting referral to ophthalmology for routine eye exam, to podiatry for left ingrown toenail and to GYN for routine exam.   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 MANN   Kidney stones    Mastodynia    Myeloproliferative disease (HCC)    JAK2 MUTATION ... FOLLOWED BY DR. NAGALLA AT [facility].   Thrombocytosis     Past Surgical History:  Procedure Laterality Date   COLONOSCOPY W/ BIOPSIES  APRIL 2010   LOW-GRADE NEUROENDOCRINE-CARCINOID TUMOR   TUBAL LIGATION     POST PARTUM    Social History:  reports that she has never smoked. She has never used smokeless tobacco. She reports that she does not drink alcohol and does not use drugs.  Allergies: No Known Allergies  Family History:  Family History  Problem Relation Age of Onset   Cancer Mother        LUNG (NON-SMOKER)   Cancer Sister        LUNG (NON-SMOKER)   Breast cancer Neg Hx      Current Outpatient Medications:    aspirin EC 81 MG tablet, Take 81 mg by mouth daily., Disp: , Rfl:    hydroxyurea (HYDREA) 500 MG capsule, Take 1 capsule by mouth in the morning and 2 capsules in the evening as directed. May take with food to minimize GI side effects., Disp: 270 capsule, Rfl: 0   Multiple Vitamins-Minerals (ZINC PO), Take 1 tablet by mouth daily at 8 pm., Disp: , Rfl:   Review of Systems:  Negative unless indicated in HPI.   Physical  Exam: Vitals:   11/03/23 1255  BP: 130/84  Weight: 143 lb 9.6 oz (65.1 kg)  Height: 5\' 2"  (1.575 m)    Body mass index is 26.26 kg/m.   Physical Exam Vitals reviewed.  Constitutional:      General: She is not in acute distress.    Appearance: Normal appearance. She is not ill-appearing, toxic-appearing or diaphoretic.  HENT:     Head: Normocephalic.     Right Ear: Tympanic membrane, ear canal and external ear normal. There is no impacted cerumen.     Left Ear: Tympanic membrane, ear canal and external ear normal. There is no impacted cerumen.     Nose: Nose normal.     Mouth/Throat:     Mouth: Mucous membranes are moist.     Pharynx: Oropharynx is clear. No oropharyngeal exudate or posterior oropharyngeal erythema.  Eyes:     General: No scleral icterus.       Right eye: No discharge.        Left eye: No discharge.     Conjunctiva/sclera: Conjunctivae normal.     Pupils: Pupils are equal, round, and reactive to light.  Neck:     Vascular: No carotid bruit.  Cardiovascular:     Rate and Rhythm: Normal rate and regular rhythm.  Pulses: Normal pulses.     Heart sounds: Normal heart sounds.  Pulmonary:     Effort: Pulmonary effort is normal. No respiratory distress.     Breath sounds: Normal breath sounds.  Abdominal:     General: Abdomen is flat. Bowel sounds are normal.     Palpations: Abdomen is soft.  Musculoskeletal:        General: Normal range of motion.     Cervical back: Normal range of motion.  Skin:    General: Skin is warm and dry.  Neurological:     General: No focal deficit present.     Mental Status: She is alert and oriented to person, place, and time. Mental status is at baseline.  Psychiatric:        Mood and Affect: Mood normal.        Behavior: Behavior normal.        Thought Content: Thought content normal.        Judgment: Judgment normal.       Impression and Plan:  Encounter for preventive health examination  History of vitamin D  deficiency -     TSH; Future -     Vitamin B12; Future -     VITAMIN D 25 Hydroxy (Vit-D Deficiency, Fractures); Future  Vision changes -     Ambulatory referral to Ophthalmology  Screening for cervical cancer -     Ambulatory referral to Gynecology  Polyp of colon, unspecified part of colon, unspecified type -     Ambulatory referral to Gastroenterology  Essential thrombocytosis (HCC) -     CBC with Differential/Platelet; Future -     Comprehensive metabolic panel; Future -     Lipid panel; Future -     Urinalysis, Routine w reflex microscopic  Ingrown left greater toenail -     Ambulatory referral to Podiatry  Encounter for screening mammogram for malignant neoplasm of breast -     3D Screening Mammogram, Left and Right; Future   -Recommend routine eye and dental care. -Healthy lifestyle discussed in detail. -Labs to be updated today. -Prostate cancer screening: N/A Health Maintenance  Topic Date Due   COVID-19 Vaccine (1) Never done   HIV Screening  Never done   Hepatitis C Screening  Never done   Zoster (Shingles) Vaccine (1 of 2) Never done   Mammogram  07/20/2019   Pap Smear  06/18/2022   Flu Shot  Never done   Colon Cancer Screening  07/12/2026   DTaP/Tdap/Td vaccine (4 - Td or Tdap) 07/16/2032   HPV Vaccine  Aged Out     -Declines flu, COVID, shingles vaccination despite counseling. -Referral to GI, GYN, ophthalmology and to podiatry for ingrown toenail.    Chaya Jan, MD [facility]

## 2023-11-04 ENCOUNTER — Encounter: Payer: Self-pay | Admitting: Internal Medicine

## 2023-11-04 DIAGNOSIS — Z1231 Encounter for screening mammogram for malignant neoplasm of breast: Secondary | ICD-10-CM

## 2023-11-04 DIAGNOSIS — Z124 Encounter for screening for malignant neoplasm of cervix: Secondary | ICD-10-CM

## 2023-11-05 ENCOUNTER — Encounter: Payer: Self-pay | Admitting: *Deleted

## 2023-11-05 NOTE — Addendum Note (Signed)
Addended by: Kern Reap B on: 11/05/2023 09:50 AM   Modules accepted: Orders

## 2023-11-09 ENCOUNTER — Ambulatory Visit (HOSPITAL_BASED_OUTPATIENT_CLINIC_OR_DEPARTMENT_OTHER)
Admission: RE | Admit: 2023-11-09 | Discharge: 2023-11-09 | Disposition: A | Discharge status: Home / Self Care | Payer: 59 | Source: Ambulatory Visit | Attending: Internal Medicine | Admitting: Internal Medicine

## 2023-11-09 ENCOUNTER — Encounter (HOSPITAL_BASED_OUTPATIENT_CLINIC_OR_DEPARTMENT_OTHER): Payer: Self-pay | Admitting: Radiology

## 2023-11-09 DIAGNOSIS — Z1231 Encounter for screening mammogram for malignant neoplasm of breast: Secondary | ICD-10-CM | POA: Diagnosis present

## 2023-11-16 ENCOUNTER — Other Ambulatory Visit: Payer: Self-pay

## 2023-11-16 ENCOUNTER — Encounter: Payer: Self-pay | Admitting: Internal Medicine

## 2023-11-16 DIAGNOSIS — H539 Unspecified visual disturbance: Secondary | ICD-10-CM

## 2023-11-18 ENCOUNTER — Inpatient Hospital Stay: Payer: Self-pay | Attending: Hematology and Oncology | Admitting: Hematology and Oncology

## 2023-11-18 ENCOUNTER — Other Ambulatory Visit (HOSPITAL_COMMUNITY): Payer: Self-pay

## 2023-11-18 ENCOUNTER — Other Ambulatory Visit: Payer: Self-pay | Admitting: Hematology and Oncology

## 2023-11-18 ENCOUNTER — Inpatient Hospital Stay: Payer: Self-pay

## 2023-11-18 VITALS — BP 123/61 | HR 65 | Temp 97.9°F | Resp 13 | Wt 141.7 lb

## 2023-11-18 DIAGNOSIS — Z7982 Long term (current) use of aspirin: Secondary | ICD-10-CM | POA: Insufficient documentation

## 2023-11-18 DIAGNOSIS — D473 Essential (hemorrhagic) thrombocythemia: Secondary | ICD-10-CM

## 2023-11-18 DIAGNOSIS — Z79899 Other long term (current) drug therapy: Secondary | ICD-10-CM | POA: Diagnosis not present

## 2023-11-18 DIAGNOSIS — D75839 Thrombocytosis, unspecified: Secondary | ICD-10-CM | POA: Diagnosis not present

## 2023-11-18 LAB — CBC WITH DIFFERENTIAL (CANCER CENTER ONLY)
Abs Immature Granulocytes: 0.01 10*3/uL (ref 0.00–0.07)
Basophils Absolute: 0.1 10*3/uL (ref 0.0–0.1)
Basophils Relative: 1 %
Eosinophils Absolute: 0.1 10*3/uL (ref 0.0–0.5)
Eosinophils Relative: 1 %
HCT: 36.4 % (ref 36.0–46.0)
Hemoglobin: 11.7 g/dL — ABNORMAL LOW (ref 12.0–15.0)
Immature Granulocytes: 0 %
Lymphocytes Relative: 37 %
Lymphs Abs: 1.7 10*3/uL (ref 0.7–4.0)
MCH: 27.1 pg (ref 26.0–34.0)
MCHC: 32.1 g/dL (ref 30.0–36.0)
MCV: 84.5 fL (ref 80.0–100.0)
Monocytes Absolute: 0.4 10*3/uL (ref 0.1–1.0)
Monocytes Relative: 8 %
Neutro Abs: 2.5 10*3/uL (ref 1.7–7.7)
Neutrophils Relative %: 53 %
Platelet Count: 561 10*3/uL — ABNORMAL HIGH (ref 150–400)
RBC: 4.31 MIL/uL (ref 3.87–5.11)
RDW: 16.9 % — ABNORMAL HIGH (ref 11.5–15.5)
WBC Count: 4.7 10*3/uL (ref 4.0–10.5)
nRBC: 0 % (ref 0.0–0.2)

## 2023-11-18 LAB — CMP (CANCER CENTER ONLY)
ALT: 20 U/L (ref 0–44)
AST: 21 U/L (ref 15–41)
Albumin: 4.5 g/dL (ref 3.5–5.0)
Alkaline Phosphatase: 86 U/L (ref 38–126)
Anion gap: 4 — ABNORMAL LOW (ref 5–15)
BUN: 11 mg/dL (ref 6–20)
CO2: 30 mmol/L (ref 22–32)
Calcium: 9.8 mg/dL (ref 8.9–10.3)
Chloride: 105 mmol/L (ref 98–111)
Creatinine: 0.63 mg/dL (ref 0.44–1.00)
GFR, Estimated: >60 mL/min
Glucose, Bld: 99 mg/dL (ref 70–99)
Potassium: 4.9 mmol/L (ref 3.5–5.1)
Sodium: 139 mmol/L (ref 135–145)
Total Bilirubin: 0.5 mg/dL (ref 0.0–1.2)
Total Protein: 7.3 g/dL (ref 6.5–8.1)

## 2023-11-18 MED ORDER — HYDROXYUREA 500 MG PO CAPS
500.0000 mg | ORAL_CAPSULE | ORAL | 1 refills | Status: DC
  Filled 2023-11-18: qty 90, 30d supply, fill #0
  Filled 2023-12-01: qty 90, 30d supply, fill #1
  Filled 2023-12-01: qty 270, 90d supply, fill #1
  Filled 2023-12-14: qty 90, 30d supply, fill #1
  Filled 2024-01-15: qty 90, 30d supply, fill #2

## 2023-11-18 NOTE — Progress Notes (Signed)
 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 Alycia at [facility] 10/04/2021: establish care with Dr. Federico   11/20/2021: Platelet count 1143, starting hydroxyurea  500 mg p.o. daily. 12/13/2021: Plt 985, recommend increasing dosing hydroxyurea  to 500mg  PO BID 01/24/2022: Plt 766, recommend increasing dosing hydroxyurea  to 500 mg in AM and 1000 mg in PM.   Interval History:  Amanda Harris 58 y.o. female with medical history significant for essential thrombocytosis who presents for a follow up visit. The patient's last visit was on 08/28/2023.  In the interim since her last visit she has was without hydroxyurea  for 1 year but now has insurance and is taking the medication as prescribed.  On exam today Mrs. De Harris reports she is tolerating the medication well though she did get a cut on her finger recently which bled considerably.  She reports it was difficult to control.  She notes that she also has a sore under her tongue.  She reports that she is not having any issues with headache but does need to see an ophthalmologist because she feels like her vision is declining.  She reports that she is not having any ankle ulcers, nausea, vomiting, or diarrhea.  Overall she is willing and able to proceed with hydroxyurea  therapy at this time.  She is taking as prescribed with 1 in the morning and 2 at night..  She currently denies any fevers, chills, sweats, nausea, vomiting or diarrhea.  A full 10 point ROS is listed below.   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  MANN   Kidney stones    Mastodynia    Myeloproliferative disease (HCC)    JAK2 MUTATION ... FOLLOWED BY DR. NAGALLA AT [facility].   Thrombocytosis     SURGICAL HISTORY: Past Surgical History:  Procedure Laterality Date   COLONOSCOPY W/ BIOPSIES  APRIL 2010   LOW-GRADE NEUROENDOCRINE-CARCINOID TUMOR   TUBAL LIGATION     POST PARTUM    SOCIAL HISTORY: Social History   Socioeconomic History   Marital status: Married    Spouse name: Not on file   Number of children: Not on file   Years of education: Not on file   Highest education level: GED or equivalent  Occupational History   Not on file  Tobacco Use   Smoking status: Never   Smokeless tobacco: Never  Substance and Sexual Activity   Alcohol use: No    Alcohol/week: 0.0 standard drinks of alcohol   Drug use: No   Sexual activity: Yes    Comment: 1st intercourse- 21, partners- 1  Other Topics Concern   Not on file  Social History Narrative   ** Merged History Encounter **       ** Merged History Encounter **       Social Drivers of Corporate Investment Banker Strain: Medium Risk (10/21/2023)   Overall Financial Resource Strain (CARDIA)    Difficulty of Paying Living Expenses: Somewhat hard  Food Insecurity: No Food Insecurity (10/21/2023)   Hunger Vital Sign    Worried About Running Out of Food in  the Last Year: Never true    Ran Out of Food in the Last Year: Never true  Transportation Needs: No Transportation Needs (10/21/2023)   PRAPARE - Administrator, Civil Service (Medical): No    Lack of Transportation (Non-Medical): No  Physical Activity: Unknown (10/21/2023)   Exercise Vital Sign    Days of Exercise per Week: 0 days    Minutes of Exercise per Session: Not on file  Stress: No Stress Concern Present (10/21/2023)   [employer] of Occupational Health - Occupational Stress Questionnaire    Feeling of Stress : Only a little  Social Connections: Socially Integrated (10/21/2023)   Social  Connection and Isolation Panel [NHANES]    Frequency of Communication with Friends and Family: Three times a week    Frequency of Social Gatherings with Friends and Family: Once a week    Attends Religious Services: More than 4 times per year    Active Member of [employer] or Organizations: Yes    Attends Engineer, Structural: More than 4 times per year    Marital Status: Married  Catering Manager Violence: Unknown (01/13/2022)   Received from [employer], [facility]   HITS    Physically Hurt: Not on file    Insult or Talk Down To: Not on file    Threaten Physical Harm: Not on file    Scream or Curse: Not on file    FAMILY HISTORY: Family History  Problem Relation Age of Onset   Cancer Mother        LUNG (NON-SMOKER)   Cancer Sister        LUNG (NON-SMOKER)   Breast cancer Neg Hx     ALLERGIES:  has no known allergies.  MEDICATIONS:  Current Outpatient Medications  Medication Sig Dispense Refill   aspirin EC 81 MG tablet Take 81 mg by mouth daily.     hydroxyurea  (HYDREA ) 500 MG capsule Take 1 capsule by mouth in the morning and 2 capsules in the evening as directed. May take with food to minimize GI side effects. 270 capsule 0   Multiple Vitamins-Minerals (ZINC PO) Take 1 tablet by mouth daily at 8 pm.     No current facility-administered medications for this visit.    REVIEW OF SYSTEMS:   Constitutional: ( - ) fevers, ( - )  chills , ( - ) night sweats Eyes: ( - ) blurriness of vision, ( - ) double vision, ( - ) watery eyes Ears, nose, mouth, throat, and face: ( - ) mucositis, ( - ) sore throat Respiratory: ( - ) cough, ( - ) dyspnea, ( - ) wheezes Cardiovascular: ( - ) palpitation, ( - ) chest discomfort, ( - ) lower extremity swelling Gastrointestinal:  ( - ) nausea, ( - ) heartburn, ( - ) change in bowel habits Skin: ( - ) abnormal skin rashes Lymphatics: ( - ) new lymphadenopathy, ( - ) easy bruising Neurological: ( - ) numbness, ( - ) tingling, ( - ) new  weaknesses Behavioral/Psych: ( - ) mood change, ( - ) new changes  All other systems were reviewed with the patient and are negative.  PHYSICAL EXAMINATION: ECOG PERFORMANCE STATUS: 1 - Symptomatic but completely ambulatory  There were no vitals filed for this visit.  There were no vitals filed for this visit.   GENERAL: Well-appearing middle-aged female, alert, no distress and comfortable SKIN: skin color, texture, turgor are normal, no rashes or significant lesions EYES: conjunctiva are  pink and non-injected, sclera clear LUNGS: clear to auscultation and percussion with normal breathing effort HEART: regular rate & rhythm and no murmurs and no lower extremity edema Musculoskeletal: no cyanosis of digits and no clubbing  PSYCH: alert & oriented x 3, fluent speech NEURO: no focal motor/sensory deficits  LABORATORY DATA:  I have reviewed the data as listed    Latest Ref Rng & Units 11/03/2023    1:38 PM 08/28/2023    9:42 AM 10/17/2022    9:41 AM  CBC  WBC 4.0 - 10.5 K/uL 7.1  6.2  4.4   Hemoglobin 12.0 - 15.0 g/dL 86.9  87.5  88.6   Hematocrit 36.0 - 46.0 % 40.4  39.3  34.6   Platelets 150.0 - 400.0 K/uL 881.0  897  346        Latest Ref Rng & Units 11/03/2023    1:38 PM 08/28/2023    9:42 AM 10/17/2022    9:41 AM  CMP  Glucose 70 - 99 mg/dL 97  898  891   BUN 6 - 23 mg/dL 12  9  13    Creatinine 0.40 - 1.20 mg/dL 9.42  9.38  9.36   Sodium 135 - 145 mEq/L 139  136  C 140   Potassium 3.5 - 5.1 mEq/L 4.8  5.1  4.5   Chloride 96 - 112 mEq/L 102  102  C 104   CO2 19 - 32 mEq/L 29  31  C 31   Calcium 8.4 - 10.5 mg/dL 9.9  9.5  9.4   Total Protein 6.0 - 8.3 g/dL 7.6  7.5  6.6   Total Bilirubin 0.2 - 1.2 mg/dL 0.6  0.7  0.7   Alkaline Phos 39 - 117 U/L 88  123  80   AST 0 - 37 U/L 21  28  17    ALT 0 - 35 U/L 28  50  14     C Corrected result    No results found for: MPROTEIN No results found for: KPAFRELGTCHN, LAMBDASER, KAPLAMBRATIO  RADIOGRAPHIC STUDIES: MM  3D SCREENING MAMMOGRAM BILATERAL BREAST Result Date: 11/10/2023 CLINICAL DATA:  Screening. EXAM: DIGITAL SCREENING BILATERAL MAMMOGRAM WITH TOMOSYNTHESIS AND CAD TECHNIQUE: Bilateral screening digital craniocaudal and mediolateral oblique mammograms were obtained. Bilateral screening digital breast tomosynthesis was performed. The images were evaluated with computer-aided detection. COMPARISON:  Previous exam(s). ACR Breast Density Category c: The breasts are heterogeneously dense, which may obscure small masses. FINDINGS: There are no findings suspicious for malignancy. IMPRESSION: No mammographic evidence of malignancy. A result letter of this screening mammogram will be mailed directly to the patient. RECOMMENDATION: Screening mammogram in one year. (Code:SM-B-01Y) BI-RADS CATEGORY  1: Negative. Electronically Signed   By: Delon Music M.D.   On: 11/10/2023 09:59    ASSESSMENT & PLAN Amanda Harris 59 y.o. female with medical history significant for essential thrombocytosis who presents for a follow up visit.  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 -- After discussion with the patient about the risks and benefits of cytoreductive therapy she was agreeable to proceeding at this time.  I noted that it was unclear if her symptoms were currently related to her thrombocytosis but she wanted to pursue cytoreductive therapy to see if it would be helpful. Plan:  --Recommend restarting hydroxyurea  500 mg in the AM and 1000 mg in the PM p.o. daily --Recommend continuation of aspirin therapy  81 mg p.o. daily alone --labs today show a WBC 4.7, hemoglobin 1.7, MCV 84.5, platelets 561 --Plan to have patient return to clinic with labs in 3 months with interval 6-week labs   No orders of the defined types were placed in this encounter.  All questions were answered. The patient knows to call the  clinic with any problems, questions or concerns.  A total of more than 30 minutes were spent on this encounter with face-to-face time and non-face-to-face time, including preparing to see the patient, ordering tests and/or medications, counseling the patient and coordination of care as outlined above.   Norleen IVAR Kidney, MD Department of Hematology/Oncology [facility] Phone: [phone] Pager: [phone] Email: [email or web address]  11/18/2023 7:42 AM

## 2023-11-19 ENCOUNTER — Other Ambulatory Visit (HOSPITAL_COMMUNITY): Payer: Self-pay

## 2023-12-01 ENCOUNTER — Other Ambulatory Visit: Payer: Self-pay

## 2023-12-01 ENCOUNTER — Other Ambulatory Visit (HOSPITAL_COMMUNITY): Payer: Self-pay

## 2023-12-01 MED ORDER — AMOXICILLIN 500 MG PO CAPS
500.0000 mg | ORAL_CAPSULE | Freq: Three times a day (TID) | ORAL | 0 refills | Status: DC
  Filled 2023-12-01: qty 30, 10d supply, fill #0

## 2023-12-01 MED ORDER — SORE THROAT SPRAY 1.4 % MT LIQD
OROMUCOSAL | 0 refills | Status: DC
  Filled 2023-12-01: qty 117, 30d supply, fill #0

## 2023-12-14 ENCOUNTER — Other Ambulatory Visit (HOSPITAL_COMMUNITY): Payer: Self-pay

## 2023-12-14 ENCOUNTER — Ambulatory Visit: Payer: 59 | Admitting: Internal Medicine

## 2023-12-15 ENCOUNTER — Other Ambulatory Visit (HOSPITAL_COMMUNITY): Payer: Self-pay

## 2023-12-15 ENCOUNTER — Encounter: Payer: Self-pay | Admitting: Obstetrics & Gynecology

## 2023-12-15 ENCOUNTER — Ambulatory Visit: Payer: 59 | Admitting: Obstetrics & Gynecology

## 2023-12-15 VITALS — BP 123/71 | HR 93 | Ht 63.0 in | Wt 141.2 lb

## 2023-12-15 DIAGNOSIS — R1032 Left lower quadrant pain: Secondary | ICD-10-CM

## 2023-12-15 DIAGNOSIS — Z01419 Encounter for gynecological examination (general) (routine) without abnormal findings: Secondary | ICD-10-CM

## 2023-12-15 DIAGNOSIS — Z860100 Personal history of colon polyps, unspecified: Secondary | ICD-10-CM | POA: Diagnosis not present

## 2023-12-15 DIAGNOSIS — Z124 Encounter for screening for malignant neoplasm of cervix: Secondary | ICD-10-CM

## 2023-12-15 DIAGNOSIS — N941 Unspecified dyspareunia: Secondary | ICD-10-CM

## 2023-12-15 DIAGNOSIS — Z1211 Encounter for screening for malignant neoplasm of colon: Secondary | ICD-10-CM

## 2023-12-15 MED ORDER — ESTRADIOL 10 MCG VA TABS
ORAL_TABLET | VAGINAL | 12 refills | Status: AC
  Filled 2023-12-15: qty 24, 84d supply, fill #0
  Filled 2024-03-18: qty 24, 84d supply, fill #1
  Filled 2024-06-15: qty 24, 84d supply, fill #2
  Filled 2024-09-14: qty 24, 84d supply, fill #3

## 2023-12-15 NOTE — Progress Notes (Signed)
 Pt presents for AEX  Last 2022 Declines STD testing  Mammogram 11-10-23 Colonoscopy unknown  Pt c/o menopausal symptoms, vaginal dryness and pain with intercourse

## 2023-12-15 NOTE — Progress Notes (Signed)
 ANNUAL PREVENTATIVE CARE GYNECOLOGY  ENCOUNTER NOTE  Subjective:       Amanda Harris is a married 58 y.o. [phone] here for a routine annual gynecologic exam. She is new to this practice.  The patient is sexually active. The patient is not taking hormone replacement therapy. Patient denies post-menopausal vaginal bleeding. The patient wears seatbelts: yes. The patient participates in regular exercise: no. Has the patient ever been transfused or tattooed?: no. The patient reports that there is not domestic violence in her life.  Current complaints: 1.  Dyspareunia due to vaginal dryness    Gynecologic History Patient's last menstrual period was 07/14/2018.  Pap at [facility] was normal. She had a AGUS pap in 2019. HR HPV was negative.   Last mammogram: 2025. 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 MANN   Kidney stones    Mastodynia    Myeloproliferative disease (HCC)    JAK2 MUTATION ... FOLLOWED BY DR. NAGALLA AT [facility].   Thrombocytosis     Family History  Problem Relation Age of Onset   Cancer Mother        LUNG (NON-SMOKER)   Cancer Sister        LUNG (NON-SMOKER)   Breast cancer Neg Hx     Past Surgical History:  Procedure Laterality Date   COLONOSCOPY W/ BIOPSIES  APRIL 2010   LOW-GRADE NEUROENDOCRINE-CARCINOID TUMOR   TUBAL LIGATION     POST PARTUM    Social History   Socioeconomic History   Marital status: Married    Spouse name: Not on file    Number of children: Not on file   Years of education: Not on file   Highest education level: GED or equivalent  Occupational History   Not on file  Tobacco Use   Smoking status: Never   Smokeless tobacco: Never  Substance and Sexual Activity   Alcohol use: No    Alcohol/week: 0.0 standard drinks of alcohol   Drug use: No   Sexual activity: Yes    Comment: 1st intercourse- 21, partners- 1  Other Topics Concern   Not on file  Social History Narrative   ** Merged History Encounter **       ** Merged History Encounter **       Social Drivers of Corporate investment banker Strain: Medium Risk (10/21/2023)   Overall Financial Resource Strain (CARDIA)    Difficulty of Paying Living Expenses: Somewhat hard  Food Insecurity: Low Risk  (12/14/2023)   Received from [facility]   Hunger Vital Sign    Worried About Running Out of Food in the Last Year: Never true    Ran Out of Food in the Last Year: Never true  Transportation  Needs: No Transportation Needs (12/14/2023)   Received from [facility]    In the past 12 months, has lack of reliable transportation kept you from medical appointments, meetings, work or from getting things needed for daily living? : No  Physical Activity: Unknown (10/21/2023)   Exercise Vital Sign    Days of Exercise per Week: 0 days    Minutes of Exercise per Session: Not on file  Stress: No Stress Concern Present (10/21/2023)   [employer] of Occupational Health - Occupational Stress Questionnaire    Feeling of Stress : Only a little  Social Connections: Socially Integrated (10/21/2023)   Social Connection and Isolation Panel [NHANES]    Frequency of Communication with Friends and Family: Three times a week    Frequency of Social Gatherings with Friends and Family: Once a week    Attends Religious Services: More than 4 times per year    Active Member of [employer] or Organizations: Yes    Attends Engineer, structural: More than 4  times per year    Marital Status: Married  Catering manager Violence: Unknown (01/13/2022)   Received from [employer]   HITS    Physically Hurt: Not on file    Insult or Talk Down To: Not on file    Threaten Physical Harm: Not on file    Scream or Curse: Not on file    Current Outpatient Medications on File Prior to Visit  Medication Sig Dispense Refill   aspirin EC 81 MG tablet Take 81 mg by mouth daily.     hydroxyurea (HYDREA) 500 MG capsule Take 1 capsule by mouth in the morning and 2 capsules in the evening as directed. May take with food to minimize GI side effects. 270 capsule 1   Multiple Vitamins-Minerals (ZINC PO) Take 1 tablet by mouth daily at 8 pm.     amoxicillin (AMOXIL) 500 MG capsule Take 1 capsule (500 mg total) by mouth 3 (three) times daily for 10 days (Patient not taking: Reported on 12/15/2023) 30 capsule 0   phenol (SORE THROAT SPRAY) 1.4 % LIQD Use 1 spray in the mouth or throat every 2 (two) hours as needed (for throat pain). (Patient not taking: Reported on 12/15/2023) 117 mL 0   No current facility-administered medications on file prior to visit.    No Known Allergies    Review of Systems ROS Review of Systems - General ROS: negative for - chills, fatigue, fever, hot flashes, night sweats, weight gain or weight loss Psychological ROS: negative for - anxiety, decreased libido, depression, mood swings, physical abuse or sexual abuse Ophthalmic ROS: negative for - blurry vision, eye pain or loss of vision ENT ROS: negative for - headaches, hearing change, visual changes or vocal changes Allergy and Immunology ROS: negative for - hives, itchy/watery eyes or seasonal allergies Hematological and Lymphatic ROS: negative for - bleeding problems, bruising, swollen lymph nodes or weight loss Endocrine ROS: negative for - galactorrhea, hair pattern changes, hot flashes, malaise/lethargy, mood swings, palpitations, polydipsia/polyuria, skin changes,  temperature intolerance or unexpected weight changes Breast ROS: negative for - new or changing breast lumps or nipple discharge Respiratory ROS: negative for - cough or shortness of breath Cardiovascular ROS: negative for - chest pain, irregular heartbeat, palpitations or shortness of breath Gastrointestinal ROS: no abdominal pain, change in bowel habits, or black or bloody stools Genito-Urinary ROS: no dysuria, trouble voiding, or hematuria Musculoskeletal ROS: negative for - joint pain or joint  stiffness Neurological ROS: negative for - bowel and bladder control changes Dermatological ROS: negative for rash and skin lesion changes   Objective:   BP 123/71   Pulse 93   Ht 5\' 3"  (1.6 m)   Wt 141 lb 3.2 oz (64 kg)   LMP 07/14/2018   BMI 25.01 kg/m  CONSTITUTIONAL: Well-developed, well-nourished female in no acute distress.  PSYCHIATRIC: Normal mood and affect. Normal behavior. Normal judgment and thought content. NEUROLGIC: Alert and oriented to person, place, and time. Normal muscle tone coordination. No cranial nerve deficit noted. HENT:  Normocephalic, atraumatic, External right and left ear normal. Oropharynx is clear and moist EYES: Conjunctivae and EOM are normal. Pupils are equal, round, and reactive to light. No scleral icterus.  NECK: Normal range of motion, supple, no masses.  Normal thyroid.  SKIN: Skin is warm and dry. No rash noted. Not diaphoretic. No erythema. No pallor. CARDIOVASCULAR: Normal heart rate noted, regular rhythm, no murmur. RESPIRATORY: Clear to auscultation bilaterally. Effort and breath sounds normal, no problems with respiration noted. BREASTS: Symmetric in size. No masses, skin changes, nipple drainage, or lymphadenopathy. ABDOMEN: Soft, normal bowel sounds, no distention noted.  No tenderness, rebound or guarding.  BLADDER: Normal PELVIC:  Bladder no bladder distension noted  Urethra: normal appearing urethra with no masses, tenderness or  lesions  Vulva: normal appearing vulva with no masses, tenderness or lesions, marked VVA  Vagina: normal appearing vagina with normal color and discharge, no lesions  Cervix: normal appearing cervix without discharge or lesions, Nabothian cyst noted, atrophic  Uterus: uterus is normal size, retroverted, normal shape, consistency and nontender  Adnexa: normal adnexa in size, nontender and no masses  RV: External Exam NormaI  MUSCULOSKELETAL: Normal range of motion. No tenderness.  No cyanosis, clubbing, or edema.  2+ distal pulses. LYMPHATIC: No Axillary, Supraclavicular, or Inguinal Adenopathy.   Labs: Lab Results  Component Value Date   WBC 4.7 11/18/2023   HGB 11.7 (L) 11/18/2023   HCT 36.4 11/18/2023   MCV 84.5 11/18/2023   PLT 561 (H) 11/18/2023    Lab Results  Component Value Date   CREATININE 0.63 11/18/2023   BUN 11 11/18/2023   NA 139 11/18/2023   K 4.9 11/18/2023   CL 105 11/18/2023   CO2 30 11/18/2023    Lab Results  Component Value Date   ALT 20 11/18/2023   AST 21 11/18/2023   ALKPHOS 86 11/18/2023   BILITOT 0.5 11/18/2023    Lab Results  Component Value Date   CHOL 191 11/03/2023   HDL 50.40 11/03/2023   LDLCALC 121 (H) 11/03/2023   TRIG 98.0 11/03/2023   CHOLHDL 4 11/03/2023    Lab Results  Component Value Date   TSH 0.62 11/03/2023    No results found for: "HGBA1C"   Assessment:   Well woman LLQ pain H/o rectal cancer/need for colon screening Vaginal dryness and dyspareunia  Plan:  Pap: done today Vaginal estrogen prescribed  Colon Screening:  GI referral made  Routine preventative health maintenance measures emphasized: Exercise/Diet/Weight control  Return to Clinic - after ultrasound results are available   Allie Bossier, MD [facility]

## 2023-12-18 ENCOUNTER — Ambulatory Visit (HOSPITAL_BASED_OUTPATIENT_CLINIC_OR_DEPARTMENT_OTHER)
Admission: RE | Admit: 2023-12-18 | Discharge: 2023-12-18 | Disposition: A | Discharge status: Home / Self Care | Source: Ambulatory Visit | Attending: Obstetrics & Gynecology | Admitting: Obstetrics & Gynecology

## 2023-12-18 DIAGNOSIS — R1032 Left lower quadrant pain: Secondary | ICD-10-CM | POA: Insufficient documentation

## 2023-12-23 LAB — CYTOLOGY - PAP
Adequacy: ABNORMAL
Comment: NEGATIVE

## 2023-12-24 ENCOUNTER — Telehealth: Payer: Self-pay | Admitting: Hematology and Oncology

## 2023-12-28 ENCOUNTER — Encounter: Payer: Self-pay | Admitting: Obstetrics & Gynecology

## 2023-12-28 ENCOUNTER — Ambulatory Visit: Admitting: Obstetrics & Gynecology

## 2023-12-28 VITALS — BP 117/73 | HR 65 | Ht 63.0 in | Wt 141.0 lb

## 2023-12-28 DIAGNOSIS — R229 Localized swelling, mass and lump, unspecified: Secondary | ICD-10-CM | POA: Diagnosis not present

## 2023-12-28 DIAGNOSIS — N951 Menopausal and female climacteric states: Secondary | ICD-10-CM | POA: Diagnosis not present

## 2023-12-28 DIAGNOSIS — Z124 Encounter for screening for malignant neoplasm of cervix: Secondary | ICD-10-CM | POA: Diagnosis not present
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
## 2023-12-28 NOTE — Progress Notes (Signed)
 Back muscle spasm from lifting at work. Wants to discuss results from U/S.

## 2023-12-29 LAB — FOLLICLE STIMULATING HORMONE: FSH: 85.1 m[IU]/mL

## 2023-12-30 ENCOUNTER — Inpatient Hospital Stay: Payer: 59

## 2023-12-31 LAB — CYTOLOGY - PAP
Adequacy: ABNORMAL
Comment: NEGATIVE

## 2024-01-15 ENCOUNTER — Other Ambulatory Visit (HOSPITAL_COMMUNITY): Payer: Self-pay

## 2024-02-02 ENCOUNTER — Encounter: Payer: Self-pay | Admitting: Internal Medicine

## 2024-02-02 ENCOUNTER — Telehealth: Payer: Self-pay

## 2024-02-02 DIAGNOSIS — R87629 Unspecified abnormal cytological findings in specimens from vagina: Secondary | ICD-10-CM

## 2024-02-02 NOTE — Telephone Encounter (Signed)
 Pt calling triage for Dr. Everardo Hitch. Says she reached out to clinic where she saw Dr. Everardo Hitch and was advised she is working here with us . She is wanting an appt to discuss pap results. Advised I would send msg to Dr. Everardo Hitch.

## 2024-02-03 NOTE — Telephone Encounter (Signed)
 Pt returned phone call and is aware

## 2024-02-03 NOTE — Telephone Encounter (Signed)
 Per Renita, pt needs to call or go to her OB/GYN clinic and fill out transfer of care form so she can be seen here. Called pt, no answer, LVMTRC.

## 2024-02-04 ENCOUNTER — Encounter: Payer: Self-pay | Admitting: Internal Medicine

## 2024-02-04 ENCOUNTER — Ambulatory Visit (INDEPENDENT_AMBULATORY_CARE_PROVIDER_SITE_OTHER): Admitting: Internal Medicine

## 2024-02-04 ENCOUNTER — Other Ambulatory Visit (HOSPITAL_COMMUNITY): Payer: Self-pay

## 2024-02-04 VITALS — BP 131/85 | HR 98 | Temp 98.2°F | Resp 18 | Ht 63.0 in | Wt 141.0 lb

## 2024-02-04 DIAGNOSIS — J069 Acute upper respiratory infection, unspecified: Secondary | ICD-10-CM

## 2024-02-04 MED ORDER — PSEUDOEPH-BROMPHEN-DM 30-2-10 MG/5ML PO SYRP
5.0000 mL | ORAL_SOLUTION | Freq: Three times a day (TID) | ORAL | 0 refills | Status: DC | PRN
  Filled 2024-02-04: qty 120, 8d supply, fill #0

## 2024-02-04 NOTE — Telephone Encounter (Signed)
 Per our manager Renita, patient needs to go to current GYN and request transfer of care to us . I called pt and she is aware of it and stated she would go today.

## 2024-02-04 NOTE — Progress Notes (Signed)
 Established Patient Office Visit     CC/Reason for Visit: URI symptoms  HPI: Amanda Harris is a 58 y.o. female who is coming in today for the above mentioned reasons.  For the past 3 days has been having congestion, postnasal drip, cough and right ear pressure.  52-year-old granddaughter with same symptoms.  Is requesting a cough syrup.   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 MANN   Kidney stones    Mastodynia    Myeloproliferative disease (HCC)    JAK2 MUTATION ... FOLLOWED BY DR. NAGALLA AT [facility].   Thrombocytosis     Past Surgical History:  Procedure Laterality Date   COLONOSCOPY W/ BIOPSIES  APRIL 2010   LOW-GRADE NEUROENDOCRINE-CARCINOID TUMOR   TUBAL LIGATION     POST PARTUM    Social History:  reports that she has never smoked. She has never used smokeless tobacco. She reports that she does not drink alcohol and does not use drugs.  Allergies: No Known Allergies  Family History:  Family History  Problem Relation Age of Onset   Cancer Mother        LUNG (NON-SMOKER)   Cancer Sister        LUNG (NON-SMOKER)   Breast cancer Neg Hx      Current Outpatient Medications:    aspirin EC 81 MG tablet, Take 81 mg by mouth daily., Disp: , Rfl:    brompheniramine-pseudoephedrine-DM 30-2-10 MG/5ML syrup, Take 5 mLs by mouth 3 (three) times daily as needed., Disp: 120 mL, Rfl: 0   Estradiol  10 MCG TABS vaginal tablet, Insert 1 tablet vaginally 2 nights weekly as directed., Disp: 30 tablet, Rfl: 12   hydroxyurea  (HYDREA ) 500 MG capsule, Take 1 capsule by mouth in the morning and 2 capsules in the evening as directed. May take with food to minimize GI side effects., Disp: 270 capsule, Rfl: 1   Multiple Vitamins-Minerals (ZINC PO), Take 1 tablet by mouth  daily at 8 pm., Disp: , Rfl:    phenol (SORE THROAT SPRAY) 1.4 % LIQD, Use 1 spray in the mouth or throat every 2 (two) hours as needed (for throat pain)., Disp: 117 mL, Rfl: 0  Review of Systems:  Negative unless indicated in HPI.   Physical Exam: Vitals:   02/04/24 0733  BP: 131/85  Pulse: 98  Resp: 18  Temp: 98.2 F (36.8 C)  TempSrc: Oral  SpO2: 98%  Weight: 141 lb (64 kg)  Height: 5\' 3"  (1.6 m)    Body mass index is 24.98 kg/m.   Physical Exam Vitals reviewed.  Constitutional:      Appearance: Normal appearance.  HENT:     Right Ear: Tympanic membrane, ear canal and external ear normal.     Left Ear: Tympanic membrane, ear canal and external ear normal.     Mouth/Throat:     Mouth: Mucous membranes are moist.     Pharynx: Posterior oropharyngeal erythema present.  Eyes:     Conjunctiva/sclera: Conjunctivae normal.     Pupils: Pupils are equal, round, and reactive to light.  Cardiovascular:     Rate and Rhythm: Normal rate and regular rhythm.  Pulmonary:     Effort: Pulmonary effort is normal.     Breath sounds: Normal breath  sounds.  Neurological:     Mental Status: She is alert.      Impression and Plan:  URI with cough and congestion -     Pseudoeph-Bromphen-DM; Take 5 mLs by mouth 3 (three) times daily as needed.  Dispense: 120 mL; Refill: 0  -Given exam findings, PNA, pharyngitis, ear infection are not likely, hence abx have not been prescribed. -Have advised rest, fluids, OTC antihistamines, cough suppressants and mucinex. -RTC if no improvement in 10-14 days.    Time spent:22 minutes reviewing chart, interviewing and examining patient and formulating plan of care.     Marguerita Shih, MD [facility]

## 2024-02-09 ENCOUNTER — Encounter: Payer: Self-pay | Admitting: Obstetrics & Gynecology

## 2024-02-10 ENCOUNTER — Inpatient Hospital Stay: Payer: 59 | Attending: Hematology and Oncology

## 2024-02-10 ENCOUNTER — Other Ambulatory Visit: Payer: Self-pay | Admitting: Hematology and Oncology

## 2024-02-10 ENCOUNTER — Inpatient Hospital Stay: Payer: 59 | Admitting: Hematology and Oncology

## 2024-02-10 ENCOUNTER — Other Ambulatory Visit (HOSPITAL_COMMUNITY): Payer: Self-pay

## 2024-02-10 VITALS — BP 123/73 | HR 84 | Temp 98.0°F | Resp 14 | Wt 138.6 lb

## 2024-02-10 DIAGNOSIS — Z79899 Other long term (current) drug therapy: Secondary | ICD-10-CM | POA: Diagnosis not present

## 2024-02-10 DIAGNOSIS — D75839 Thrombocytosis, unspecified: Secondary | ICD-10-CM | POA: Insufficient documentation

## 2024-02-10 DIAGNOSIS — D473 Essential (hemorrhagic) thrombocythemia: Secondary | ICD-10-CM

## 2024-02-10 LAB — CBC WITH DIFFERENTIAL (CANCER CENTER ONLY)
Abs Immature Granulocytes: 0.01 10*3/uL (ref 0.00–0.07)
Basophils Absolute: 0 10*3/uL (ref 0.0–0.1)
Basophils Relative: 1 %
Eosinophils Absolute: 0.1 10*3/uL (ref 0.0–0.5)
Eosinophils Relative: 2 %
HCT: 34.6 % — ABNORMAL LOW (ref 36.0–46.0)
Hemoglobin: 11.3 g/dL — ABNORMAL LOW (ref 12.0–15.0)
Immature Granulocytes: 0 %
Lymphocytes Relative: 37 %
Lymphs Abs: 1.5 10*3/uL (ref 0.7–4.0)
MCH: 30.8 pg (ref 26.0–34.0)
MCHC: 32.7 g/dL (ref 30.0–36.0)
MCV: 94.3 fL (ref 80.0–100.0)
Monocytes Absolute: 0.3 10*3/uL (ref 0.1–1.0)
Monocytes Relative: 8 %
Neutro Abs: 2.1 10*3/uL (ref 1.7–7.7)
Neutrophils Relative %: 52 %
Platelet Count: 470 10*3/uL — ABNORMAL HIGH (ref 150–400)
RBC: 3.67 MIL/uL — ABNORMAL LOW (ref 3.87–5.11)
RDW: 14.6 % (ref 11.5–15.5)
WBC Count: 4 10*3/uL (ref 4.0–10.5)
nRBC: 0 % (ref 0.0–0.2)

## 2024-02-10 LAB — CMP (CANCER CENTER ONLY)
ALT: 15 U/L (ref 0–44)
AST: 18 U/L (ref 15–41)
Albumin: 4.4 g/dL (ref 3.5–5.0)
Alkaline Phosphatase: 73 U/L (ref 38–126)
Anion gap: 5 (ref 5–15)
BUN: 8 mg/dL (ref 6–20)
CO2: 30 mmol/L (ref 22–32)
Calcium: 9.2 mg/dL (ref 8.9–10.3)
Chloride: 103 mmol/L (ref 98–111)
Creatinine: 0.65 mg/dL (ref 0.44–1.00)
GFR, Estimated: >60 mL/min (ref >60)
Glucose, Bld: 94 mg/dL (ref 70–99)
Potassium: 4.8 mmol/L (ref 3.5–5.1)
Sodium: 138 mmol/L (ref 135–145)
Total Bilirubin: 0.6 mg/dL (ref 0.0–1.2)
Total Protein: 7.1 g/dL (ref 6.5–8.1)

## 2024-02-10 MED ORDER — HYDROXYUREA 500 MG PO CAPS
500.0000 mg | ORAL_CAPSULE | ORAL | 1 refills | Status: AC
  Filled 2024-02-10: qty 90, 30d supply, fill #0
  Filled 2024-03-18: qty 90, 30d supply, fill #1
  Filled 2024-06-15: qty 90, 30d supply, fill #2
  Filled 2024-09-14: qty 90, 30d supply, fill #3

## 2024-02-10 NOTE — Progress Notes (Signed)
 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 Gracelyn Laurence at [facility] 10/04/2021: establish care with Dr. Rosaline Coma   11/20/2021: Platelet count 1143, starting hydroxyurea  500 mg p.o. daily. 12/13/2021: Plt 985, recommend increasing dosing hydroxyurea  to 500mg  PO BID 01/24/2022: Plt 766, recommend increasing dosing hydroxyurea  to 500 mg in AM and 1000 mg in PM.  02/10/2024: Plt 470, recommend continuing dosing hydroxyurea  to 500 mg in AM and 1000 mg in PM.   Interval History:  Amanda Harris 58 y.o. female with medical history significant for essential thrombocytosis who presents for a follow up visit. The patient's last visit was on 11/18/2023.  In the interim since her last visit she has continued on hydroxyurea  as prescribed.  On exam today Amanda Harris reports she is taken the medication faithfully 1 in the morning and 2 in the evening.  She reports that she unfortunately canceled her last lab visit because her lab visits not covered by insurance.  She reports that she is doing well overall with no diarrhea abdominal discomfort or nausea vomiting with the medication.  She reports does make her skin dry but she is not getting any mouth ulcers or ankle ulcers.  She reports that she has no signs or symptoms concerning for blood clot such as leg pain, leg swelling, chest pain, or shortness of breath.  Reports she is having some trouble with allergies and for that she was prescribed Benadryl which has been helpful.  Otherwise she denies any fevers, chills, sweats.  A full 10 point ROS is otherwise negative.  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 MANN   Kidney stones    Mastodynia    Myeloproliferative disease (HCC)    JAK2 MUTATION ... FOLLOWED BY DR. NAGALLA AT [facility].   Thrombocytosis     SURGICAL HISTORY: Past Surgical History:  Procedure Laterality Date   COLONOSCOPY W/ BIOPSIES  APRIL 2010   LOW-GRADE NEUROENDOCRINE-CARCINOID TUMOR   TUBAL LIGATION     POST PARTUM    SOCIAL HISTORY: Social History   Socioeconomic History   Marital status: Married    Spouse name: Not on file   Number of children: Not on file   Years of education: Not on file   Highest education level: GED or equivalent  Occupational History   Not on file  Tobacco Use   Smoking status: Never   Smokeless tobacco: Never  Substance and Sexual Activity   Alcohol use: No    Alcohol/week: 0.0 standard drinks of alcohol   Drug use: No   Sexual activity: Yes    Comment: 1st intercourse- 21, partners- 1  Other Topics Concern   Not on file  Social History Narrative   ** Merged History Encounter **       ** Merged History Encounter **       Social Drivers of Health   Financial Resource Strain: Medium Risk (10/21/2023)   Overall Financial Resource Strain (CARDIA)    Difficulty of Paying Living Expenses: Somewhat hard  Food Insecurity: Low Risk  (12/14/2023)   Received from [facility]  Hunger Vital Sign    Worried About Running Out of Food in the Last Year: Never true    Ran Out of Food in the Last Year: Never true  Transportation Needs: No Transportation Needs (12/14/2023)   Received from [facility]    In the past 12 months, has lack of reliable transportation kept you from medical appointments, meetings, work or from getting things needed for daily living? : No  Physical Activity: Unknown (10/21/2023)   Exercise Vital Sign    Days of Exercise per Week: 0 days    Minutes of Exercise per Session: Not on file  Stress: No Stress Concern  Present (10/21/2023)   [employer] of Occupational Health - Occupational Stress Questionnaire    Feeling of Stress : Only a little  Social Connections: Socially Integrated (10/21/2023)   Social Connection and Isolation Panel [NHANES]    Frequency of Communication with Friends and Family: Three times a week    Frequency of Social Gatherings with Friends and Family: Once a week    Attends Religious Services: More than 4 times per year    Active Member of [employer] or Organizations: Yes    Attends Engineer, structural: More than 4 times per year    Marital Status: Married  Catering manager Violence: Unknown (01/13/2022)   Received from [employer]   HITS    Physically Hurt: Not on file    Insult or Talk Down To: Not on file    Threaten Physical Harm: Not on file    Scream or Curse: Not on file    FAMILY HISTORY: Family History  Problem Relation Age of Onset   Cancer Mother        LUNG (NON-SMOKER)   Cancer Sister        LUNG (NON-SMOKER)   Breast cancer Neg Hx     ALLERGIES:  has no known allergies.  MEDICATIONS:  Current Outpatient Medications  Medication Sig Dispense Refill   aspirin EC 81 MG tablet Take 81 mg by mouth daily.     brompheniramine-pseudoephedrine-DM 30-2-10 MG/5ML syrup Take 5 mLs by mouth 3 (three) times daily as needed. 120 mL 0   Estradiol  10 MCG TABS vaginal tablet Insert 1 tablet vaginally 2 nights weekly as directed. 30 tablet 12   hydroxyurea  (HYDREA ) 500 MG capsule Take 1 capsule by mouth in the morning and 2 capsules in the evening as directed. May take with food to minimize GI side effects. 270 capsule 1   Multiple Vitamins-Minerals (ZINC PO) Take 1 tablet by mouth daily at 8 pm.     phenol (SORE THROAT SPRAY) 1.4 % LIQD Use 1 spray in the mouth or throat every 2 (two) hours as needed (for throat pain). 117 mL 0   No current facility-administered medications for this visit.    REVIEW OF SYSTEMS:   Constitutional: ( - )  fevers, ( - )  chills , ( - ) night sweats Eyes: ( - ) blurriness of vision, ( - ) double vision, ( - ) watery eyes Ears, nose, mouth, throat, and face: ( - ) mucositis, ( - ) sore throat Respiratory: ( - ) cough, ( - ) dyspnea, ( - ) wheezes Cardiovascular: ( - ) palpitation, ( - ) chest discomfort, ( - ) lower extremity swelling Gastrointestinal:  ( - ) nausea, ( - ) heartburn, ( - ) change in bowel habits Skin: ( - ) abnormal skin rashes Lymphatics: ( - )  new lymphadenopathy, ( - ) easy bruising Neurological: ( - ) numbness, ( - ) tingling, ( - ) new weaknesses Behavioral/Psych: ( - ) mood change, ( - ) new changes  All other systems were reviewed with the patient and are negative.  PHYSICAL EXAMINATION: ECOG PERFORMANCE STATUS: 1 - Symptomatic but completely ambulatory  Vitals:   02/10/24 1036  BP: 123/73  Pulse: 84  Resp: 14  Temp: 98 F (36.7 C)  SpO2: 100%    Filed Weights   02/10/24 1036  Weight: 138 lb 9.6 oz (62.9 kg)     GENERAL: Well-appearing middle-aged female, alert, no distress and comfortable SKIN: skin color, texture, turgor are normal, no rashes or significant lesions EYES: conjunctiva are pink and non-injected, sclera clear LUNGS: clear to auscultation and percussion with normal breathing effort HEART: regular rate & rhythm and no murmurs and no lower extremity edema Musculoskeletal: no cyanosis of digits and no clubbing  PSYCH: alert & oriented x 3, fluent speech NEURO: no focal motor/sensory deficits  LABORATORY DATA:  I have reviewed the data as listed    Latest Ref Rng & Units 02/10/2024   10:13 AM 11/18/2023   10:44 AM 11/03/2023    1:38 PM  CBC  WBC 4.0 - 10.5 K/uL 4.0  4.7  7.1   Hemoglobin 12.0 - 15.0 g/dL 16.1  09.6  04.5   Hematocrit 36.0 - 46.0 % 34.6  36.4  40.4   Platelets 150 - 400 K/uL 470  561  881.0        Latest Ref Rng & Units 02/10/2024   10:13 AM 11/18/2023   10:44 AM 11/03/2023    1:38 PM  CMP  Glucose 70 - 99 mg/dL 94  99   97   BUN 6 - 20 mg/dL 8  11  12    Creatinine 0.44 - 1.00 mg/dL 4.09  8.11  9.14   Sodium 135 - 145 mmol/L 138  139  139   Potassium 3.5 - 5.1 mmol/L 4.8  4.9  4.8   Chloride 98 - 111 mmol/L 103  105  102   CO2 22 - 32 mmol/L 30  30  29    Calcium 8.9 - 10.3 mg/dL 9.2  9.8  9.9   Total Protein 6.5 - 8.1 g/dL 7.1  7.3  7.6   Total Bilirubin 0.0 - 1.2 mg/dL 0.6  0.5  0.6   Alkaline Phos 38 - 126 U/L 73  86  88   AST 15 - 41 U/L 18  21  21    ALT 0 - 44 U/L 15  20  28      No results found for: "MPROTEIN" No results found for: "KPAFRELGTCHN", "LAMBDASER", "KAPLAMBRATIO"  RADIOGRAPHIC STUDIES: No results found.   ASSESSMENT & PLAN Amanda Harris 58 y.o. female with medical history significant for essential thrombocytosis who presents for a follow up visit.  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 -- After discussion with the patient about the risks and benefits of cytoreductive therapy she was agreeable to proceeding at this time.  I noted that it was unclear if her symptoms were currently related to her thrombocytosis but she wanted to pursue cytoreductive therapy to see if it would be helpful. Plan:  --Recommend restarting hydroxyurea  500 mg in the AM and 1000 mg in the PM p.o. daily --Recommend continuation of aspirin therapy 81 mg p.o. daily alone --labs  today show a WBC 4.0, Hgb 11.3, MCV 94.3, Plt 470  --Plan to have patient return to clinic with labs in 3 months    No orders of the defined types were placed in this encounter.  All questions were answered. The patient knows to call the clinic with any problems, questions or concerns.  A total of more than 30 minutes were spent on this encounter with face-to-face time and non-face-to-face time, including preparing to see the patient, ordering tests and/or medications, counseling the patient and coordination of care as  outlined above.   Rogerio Clay, MD Department of Hematology/Oncology [facility] Phone: [phone] Pager: [phone] Email: [email or web address]  02/10/2024 1:45 PM

## 2024-02-22 ENCOUNTER — Ambulatory Visit: Admitting: Obstetrics & Gynecology

## 2024-02-24 ENCOUNTER — Encounter: Payer: Self-pay | Admitting: Obstetrics & Gynecology

## 2024-02-24 ENCOUNTER — Ambulatory Visit: Admitting: Obstetrics & Gynecology

## 2024-02-24 VITALS — BP 127/80 | HR 113 | Ht 64.0 in | Wt 138.0 lb

## 2024-02-24 DIAGNOSIS — R102 Pelvic and perineal pain: Secondary | ICD-10-CM

## 2024-02-24 DIAGNOSIS — D3A026 Benign carcinoid tumor of the rectum: Secondary | ICD-10-CM

## 2024-02-24 DIAGNOSIS — M795 Residual foreign body in soft tissue: Secondary | ICD-10-CM | POA: Diagnosis not present

## 2024-02-24 NOTE — Progress Notes (Signed)
    GYNECOLOGY PROGRESS NOTE  Subjective:    Patient ID: Amanda Harris, female    DOB: 06/10/1966, 58 y.o.   MRN: [id number]  HPI  Patient is a 58 y.o. [id number] here for follow up from the visit I saw her in the [place] office. At that time she was complaining of a 6 month h/o bilateral lower pelvic pain. I ordered and ultrasound and it showed a small uterus and non-visualized ovaries, completely normal. She reports a daily bowel movement, no constipation. She does have some "stomach upset" that she attributes to the baby asa she takes.She had a colonoscopy in 2022 with [facility] in [place] due to a h/o rectal carcinoid tumor.    The second issue from her visit was a foreign body left in her left buttock, probably a injected estrogen pellet from [place] in 08/2021. I checked her [facility] and is was in the postmenopausal range.  She reports that her last pap was in 2022 in [place] and was normal. She denies a h/o abnormal pap smears. Both of the pap smears I did were read as non-diagnostic. She was started on vaginal estrogen for symptoms of vaginal atrophy including dyspareunia. The pain with sex is somewhat better.   The following portions of the patient's history were reviewed and updated as appropriate: allergies, current medications, past family history, past medical history, past social history, past surgical history, and problem list.  Review of Systems Pertinent items are noted in HPI.   Objective:   Blood pressure 127/80, pulse (!) 113, height 5\' 4"  (1.626 m), weight 138 lb (62.6 kg), last menstrual period 07/14/2018. Body mass index is 23.69 kg/m. Well nourished, well hydrated Latina, no apparent distress     Assessment:   1. Foreign body (FB) in soft tissue   2.      8 month h/o pelvic with normal gyn ultrasound. I don't believe that this discomfort is gyn in etiology. I rec that she follow up with her GI at [facility] 3.      Inadequate  specimen of pap smear with no h/o abnormals and normal pap in 2022. I rec a repeat pap in 2027.  Plan:   1. Foreign body (FB) in soft tissue (Primary)  - Ambulatory referral to General Surgery   2. Referral to GI

## 2024-03-18 ENCOUNTER — Other Ambulatory Visit (HOSPITAL_COMMUNITY): Payer: Self-pay

## 2024-04-19 NOTE — Progress Notes (Signed)
 No show

## 2024-04-20 ENCOUNTER — Inpatient Hospital Stay: Attending: Hematology and Oncology

## 2024-04-20 ENCOUNTER — Inpatient Hospital Stay: Admitting: Hematology and Oncology

## 2024-04-20 DIAGNOSIS — D473 Essential (hemorrhagic) thrombocythemia: Secondary | ICD-10-CM

## 2024-06-15 ENCOUNTER — Other Ambulatory Visit (HOSPITAL_COMMUNITY): Payer: Self-pay

## 2024-06-28 ENCOUNTER — Other Ambulatory Visit (HOSPITAL_COMMUNITY): Payer: Self-pay

## 2024-06-28 ENCOUNTER — Encounter: Payer: Self-pay | Admitting: Internal Medicine

## 2024-06-28 ENCOUNTER — Ambulatory Visit: Admitting: Internal Medicine

## 2024-06-28 VITALS — BP 122/82 | HR 114 | Temp 98.2°F | Wt 143.8 lb

## 2024-06-28 DIAGNOSIS — J301 Allergic rhinitis due to pollen: Secondary | ICD-10-CM | POA: Diagnosis not present

## 2024-06-28 DIAGNOSIS — J302 Other seasonal allergic rhinitis: Secondary | ICD-10-CM

## 2024-06-28 DIAGNOSIS — R300 Dysuria: Secondary | ICD-10-CM

## 2024-06-28 DIAGNOSIS — R519 Headache, unspecified: Secondary | ICD-10-CM | POA: Diagnosis not present

## 2024-06-28 DIAGNOSIS — R3129 Other microscopic hematuria: Secondary | ICD-10-CM

## 2024-06-28 DIAGNOSIS — R109 Unspecified abdominal pain: Secondary | ICD-10-CM

## 2024-06-28 LAB — URINALYSIS, ROUTINE W REFLEX MICROSCOPIC
Leukocytes,Ua: NEGATIVE
Nitrite: NEGATIVE
Specific Gravity, Urine: 1.025 (ref 1.000–1.030)
Total Protein, Urine: 100 — AB
Urine Glucose: NEGATIVE
Urobilinogen, UA: 1 (ref 0.0–1.0)
pH: 6 (ref 5.0–8.0)

## 2024-06-28 LAB — POC URINALSYSI DIPSTICK (AUTOMATED)
Glucose, UA: NEGATIVE
Leukocytes, UA: NEGATIVE
Nitrite, UA: NEGATIVE
Protein, UA: POSITIVE — AB
Spec Grav, UA: 1.025 (ref 1.010–1.025)
Urobilinogen, UA: 0.2 U/dL
pH, UA: 6 (ref 5.0–8.0)

## 2024-06-28 MED ORDER — SULFAMETHOXAZOLE-TRIMETHOPRIM 800-160 MG PO TABS
1.0000 | ORAL_TABLET | Freq: Two times a day (BID) | ORAL | 0 refills | Status: AC
  Filled 2024-06-28: qty 14, 7d supply, fill #0

## 2024-06-28 NOTE — Progress Notes (Signed)
 Established Patient Office Visit     CC/Reason for Visit: Discuss 2 acute issues  HPI: Amanda Harris is a 58 y.o. female who is coming in today for the above mentioned reasons.   1.  For the past 6 weeks or so she has been having sinus and maxillary headaches, postnasal drip, rhinorrhea, mild sore throat, sensation of ear fullness.  2.  For about a month has been having a sense of urinary frequency and urgency.  Over the past couple days has started having some dysuria.  Has been having significant left flank pain.   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 MANN   Kidney stones    Mastodynia    Myeloproliferative disease (HCC)    JAK2 MUTATION ... FOLLOWED BY DR. NAGALLA AT [facility].   Thrombocytosis     Past Surgical History:  Procedure Laterality Date   COLONOSCOPY W/ BIOPSIES  APRIL 2010   LOW-GRADE NEUROENDOCRINE-CARCINOID TUMOR   TUBAL LIGATION     POST PARTUM    Social History:  reports that she has never smoked. She has never used smokeless tobacco. She reports that she does not drink alcohol and does not use drugs.  Allergies: No Known Allergies  Family History:  Family History  Problem Relation Age of Onset   Cancer Mother        LUNG (NON-SMOKER)   Cancer Sister        LUNG (NON-SMOKER)   Breast cancer Neg Hx      Current Outpatient Medications:    aspirin EC 81 MG tablet, Take 81 mg by mouth daily., Disp: , Rfl:    Estradiol  10 MCG TABS vaginal tablet, Insert 1 tablet vaginally 2 nights weekly as directed., Disp: 30 tablet, Rfl: 12   hydroxyurea  (HYDREA ) 500 MG capsule, Take 1 capsule by mouth in the morning and 2 capsules in the evening as directed. May take with food to minimize GI side effects., Disp: 270 capsule, Rfl: 1   Multiple  Vitamins-Minerals (ZINC PO), Take 1 tablet by mouth daily at 8 pm., Disp: , Rfl:    sulfamethoxazole -trimethoprim  (BACTRIM  DS) 800-160 MG tablet, Take 1 tablet by mouth 2 (two) times daily for 7 days., Disp: 14 tablet, Rfl: 0  Review of Systems:  Negative unless indicated in HPI.   Physical Exam: Vitals:   06/28/24 1050  BP: 122/82  Pulse: (!) 114  Temp: 98.2 F (36.8 C)  TempSrc: Oral  SpO2: 99%  Weight: 143 lb 12.8 oz (65.2 kg)    Body mass index is 24.68 kg/m.   Physical Exam   Impression and Plan:  Dysuria -     POCT Urinalysis Dipstick (Automated) -     Urinalysis; Future -     Urine Culture  Seasonal allergies  Sinus headache  Seasonal allergic rhinitis due to pollen  Other microscopic hematuria -     Sulfamethoxazole -Trimethoprim ; Take 1 tablet by mouth 2 (two) times daily for 7 days.  Dispense: 14 tablet; Refill: 0 -     US  RENAL; Future  Left flank pain -     Sulfamethoxazole -Trimethoprim ; Take 1 tablet by mouth 2 (two) times daily for 7 days.  Dispense: 14 tablet; Refill: 0 -     US  RENAL; Future   -  Suspect upper respiratory symptoms are related to seasonal allergies.  Have advised a combination of an antihistamine with Flonase taken daily. - Suspect she might have nephrolithiasis given her flank pain and hematuria.  Will send for urine culture and treat with antibiotics.  Will also arrange for her to get a renal ultrasound.  Time spent:31 minutes reviewing chart, interviewing and examining patient and formulating plan of care.     Tully Theophilus Andrews, MD [facility]

## 2024-06-29 LAB — URINE CULTURE
MICRO NUMBER:: 16974122
Result:: NO GROWTH
SPECIMEN QUALITY:: ADEQUATE

## 2024-07-20 ENCOUNTER — Encounter: Payer: Self-pay | Admitting: Internal Medicine

## 2024-07-21 ENCOUNTER — Encounter: Payer: Self-pay | Admitting: Internal Medicine

## 2024-07-29 ENCOUNTER — Ambulatory Visit (HOSPITAL_BASED_OUTPATIENT_CLINIC_OR_DEPARTMENT_OTHER)
Admission: RE | Admit: 2024-07-29 | Discharge: 2024-07-29 | Disposition: A | Source: Ambulatory Visit | Attending: Internal Medicine | Admitting: Internal Medicine

## 2024-07-29 DIAGNOSIS — R3129 Other microscopic hematuria: Secondary | ICD-10-CM | POA: Insufficient documentation

## 2024-07-29 DIAGNOSIS — R10A2 Flank pain, left side: Secondary | ICD-10-CM | POA: Insufficient documentation

## 2024-08-01 ENCOUNTER — Ambulatory Visit: Payer: Self-pay | Admitting: Internal Medicine

## 2024-08-02 ENCOUNTER — Encounter: Payer: Self-pay | Admitting: Internal Medicine

## 2024-08-02 ENCOUNTER — Ambulatory Visit: Admitting: Internal Medicine

## 2024-08-02 VITALS — BP 110/78 | HR 79 | Temp 97.9°F | Wt 143.8 lb

## 2024-08-02 DIAGNOSIS — N029 Recurrent and persistent hematuria with unspecified morphologic changes: Secondary | ICD-10-CM | POA: Diagnosis not present

## 2024-08-02 DIAGNOSIS — R300 Dysuria: Secondary | ICD-10-CM | POA: Diagnosis not present

## 2024-08-02 LAB — POC URINALSYSI DIPSTICK (AUTOMATED)
Bilirubin, UA: NEGATIVE
Glucose, UA: NEGATIVE
Ketones, UA: NEGATIVE
Leukocytes, UA: NEGATIVE
Nitrite, UA: NEGATIVE
Protein, UA: NEGATIVE
Spec Grav, UA: 1.01 (ref 1.010–1.025)
Urobilinogen, UA: 0.2 U/dL
pH, UA: 7.5 (ref 5.0–8.0)

## 2024-08-02 LAB — URINALYSIS
Bilirubin Urine: NEGATIVE
Ketones, ur: NEGATIVE
Leukocytes,Ua: NEGATIVE
Nitrite: NEGATIVE
Specific Gravity, Urine: 1.015 (ref 1.000–1.030)
Total Protein, Urine: NEGATIVE
Urine Glucose: NEGATIVE
Urobilinogen, UA: 0.2 (ref 0.0–1.0)
pH: 7.5 (ref 5.0–8.0)

## 2024-08-02 NOTE — Progress Notes (Signed)
     Established Patient Office Visit     CC/Reason for Visit: Dark urine, foul odor  HPI: Amanda Harris is a 58 y.o. female who is coming in today for the above mentioned reasons.  This has been ongoing now for about 8 weeks.  She was seen in September for the same, urine analysis only positive for blood with culture ended up being negative she was treated with antibiotics at that time.  She had a renal ultrasound that showed no hydronephrosis or obstruction.  She comes in today with above symptoms.  Urine analysis again shows blood only.   Past Medical/Surgical History: Past Medical History:  Diagnosis Date   ASCUS (atypical squamous cells of undetermined significance) on Pap smear 05/2001   C & B BX- ASCUS    Carcinoid tumor of colon (HCC) APRIL 2010   FOLLOWED BY DR. MANN   Kidney stones    Mastodynia    Myeloproliferative disease (HCC)    JAK2 MUTATION ... FOLLOWED BY DR. NAGALLA AT [facility].   Thrombocytosis     Past Surgical History:  Procedure Laterality Date   COLONOSCOPY W/ BIOPSIES  APRIL 2010   LOW-GRADE NEUROENDOCRINE-CARCINOID TUMOR   TUBAL LIGATION     POST PARTUM    Social History:  reports that she has never smoked. She has never used smokeless tobacco. She reports that she does not drink alcohol and does not use drugs.  Allergies: No Known Allergies  Family History:  Family History  Problem Relation Age of Onset   Cancer Mother        LUNG (NON-SMOKER)   Cancer Sister        LUNG (NON-SMOKER)   Breast cancer Neg Hx      Current Outpatient Medications:    aspirin EC 81 MG tablet, Take 81 mg by mouth daily., Disp: , Rfl:    Estradiol  10 MCG TABS vaginal tablet, Insert 1 tablet vaginally 2 nights weekly as directed., Disp: 30 tablet, Rfl: 12   hydroxyurea  (HYDREA ) 500 MG capsule, Take 1 capsule by mouth in the morning and 2 capsules in the evening as directed. May take with food to minimize GI side effects., Disp: 270  capsule, Rfl: 1   Multiple Vitamins-Minerals (ZINC PO), Take 1 tablet by mouth daily at 8 pm., Disp: , Rfl:   Review of Systems:  Negative unless indicated in HPI.   Physical Exam: Vitals:   08/02/24 1034  BP: 110/78  Pulse: 79  Temp: 97.9 F (36.6 C)  TempSrc: Oral  SpO2: 99%  Weight: 143 lb 12.8 oz (65.2 kg)    Body mass index is 24.68 kg/m.    Impression and Plan:  Persistent hematuria -     Ambulatory referral to Urology  Dysuria -     POCT Urinalysis Dipstick (Automated) -     Urinalysis; Future -     Urine Culture; Future   - I will send for urine culture but will elect not to treat with further antibiotics given result. - Will place referral to urology for persistent hematuria, probably needs a cystoscopy.  Time spent:30 minutes reviewing chart, interviewing and examining patient and formulating plan of care.     Amanda Theophilus Andrews, MD [place] [facility] at [facility]

## 2024-08-03 LAB — URINE CULTURE
MICRO NUMBER:: 17127243
Result:: NO GROWTH
SPECIMEN QUALITY:: ADEQUATE

## 2024-08-04 ENCOUNTER — Ambulatory Visit: Payer: Self-pay | Admitting: Internal Medicine

## 2024-08-11 ENCOUNTER — Encounter: Payer: Self-pay | Admitting: Internal Medicine

## 2024-09-14 ENCOUNTER — Other Ambulatory Visit (HOSPITAL_COMMUNITY): Payer: Self-pay
